# Patient Record
Sex: Female | Born: 1955
Health system: Southern US, Community
[De-identification: ages and names within clinical notes are randomized; demographics above are authoritative.]

## PROBLEM LIST (undated history)

## (undated) DIAGNOSIS — D72819 Decreased white blood cell count, unspecified: Secondary | ICD-10-CM

## (undated) DIAGNOSIS — K5900 Constipation, unspecified: Secondary | ICD-10-CM

## (undated) DIAGNOSIS — I10 Essential (primary) hypertension: Secondary | ICD-10-CM

## (undated) DIAGNOSIS — I471 Supraventricular tachycardia: Secondary | ICD-10-CM

## (undated) DIAGNOSIS — M79606 Pain in leg, unspecified: Secondary | ICD-10-CM

## (undated) DIAGNOSIS — R945 Abnormal results of liver function studies: Secondary | ICD-10-CM

## (undated) DIAGNOSIS — R7989 Other specified abnormal findings of blood chemistry: Secondary | ICD-10-CM

## (undated) DIAGNOSIS — M6282 Rhabdomyolysis: Secondary | ICD-10-CM

## (undated) DIAGNOSIS — R1013 Epigastric pain: Secondary | ICD-10-CM

## (undated) DIAGNOSIS — M546 Pain in thoracic spine: Secondary | ICD-10-CM

## (undated) DIAGNOSIS — N952 Postmenopausal atrophic vaginitis: Secondary | ICD-10-CM

## (undated) DIAGNOSIS — N183 Chronic kidney disease, stage 3 (moderate): Secondary | ICD-10-CM

## (undated) DIAGNOSIS — D649 Anemia, unspecified: Secondary | ICD-10-CM

## (undated) DIAGNOSIS — E785 Hyperlipidemia, unspecified: Secondary | ICD-10-CM

## (undated) DIAGNOSIS — E870 Hyperosmolality and hypernatremia: Secondary | ICD-10-CM

## (undated) HISTORY — DX: Hyperosmolality and hypernatremia: E87.0

## (undated) HISTORY — DX: Abnormal results of liver function studies: R94.5

## (undated) HISTORY — DX: Pain in leg, unspecified: M79.606

## (undated) HISTORY — DX: Constipation, unspecified: K59.00

## (undated) HISTORY — DX: Hyperlipidemia, unspecified: E78.5

## (undated) HISTORY — PX: BACK SURGERY: SHX140

## (undated) HISTORY — DX: Supraventricular tachycardia: I47.1

## (undated) HISTORY — DX: Anemia, unspecified: D64.9

## (undated) HISTORY — DX: Chronic kidney disease, stage 3 (moderate): N18.3

## (undated) HISTORY — DX: Epigastric pain: R10.13

## (undated) HISTORY — PX: CHOLECYSTECTOMY: SHX55

## (undated) HISTORY — DX: Pain in thoracic spine: M54.6

## (undated) HISTORY — DX: Decreased white blood cell count, unspecified: D72.819

## (undated) HISTORY — DX: Postmenopausal atrophic vaginitis: N95.2

## (undated) HISTORY — PX: OTHER SURGICAL HISTORY: SHX169

## (undated) HISTORY — DX: Rhabdomyolysis: M62.82

## (undated) HISTORY — DX: Other specified abnormal findings of blood chemistry: R79.89

## (undated) HISTORY — DX: Essential (primary) hypertension: I10

---

## 2002-09-19 ENCOUNTER — Ambulatory Visit (HOSPITAL_COMMUNITY): Admission: RE | Admit: 2002-09-19 | Discharge: 2002-09-19 | Payer: Self-pay | Admitting: Internal Medicine

## 2002-09-19 ENCOUNTER — Encounter: Payer: Self-pay | Admitting: Internal Medicine

## 2002-10-17 ENCOUNTER — Emergency Department (HOSPITAL_COMMUNITY): Admission: EM | Admit: 2002-10-17 | Discharge: 2002-10-17 | Payer: Self-pay | Admitting: Emergency Medicine

## 2002-11-15 ENCOUNTER — Encounter: Payer: Self-pay | Admitting: General Surgery

## 2002-11-17 ENCOUNTER — Encounter (INDEPENDENT_AMBULATORY_CARE_PROVIDER_SITE_OTHER): Payer: Self-pay | Admitting: *Deleted

## 2002-11-17 ENCOUNTER — Ambulatory Visit (HOSPITAL_COMMUNITY): Admission: RE | Admit: 2002-11-17 | Discharge: 2002-11-18 | Payer: Self-pay | Admitting: General Surgery

## 2003-10-16 ENCOUNTER — Ambulatory Visit (HOSPITAL_COMMUNITY): Admission: RE | Admit: 2003-10-16 | Discharge: 2003-10-16 | Payer: Self-pay | Admitting: Nurse Practitioner

## 2004-03-24 ENCOUNTER — Ambulatory Visit (HOSPITAL_COMMUNITY): Admission: RE | Admit: 2004-03-24 | Discharge: 2004-03-24 | Payer: Self-pay | Admitting: Internal Medicine

## 2004-05-26 ENCOUNTER — Ambulatory Visit: Payer: Self-pay | Admitting: Nurse Practitioner

## 2004-06-03 ENCOUNTER — Ambulatory Visit: Payer: Self-pay | Admitting: Nurse Practitioner

## 2004-06-24 ENCOUNTER — Ambulatory Visit: Payer: Self-pay | Admitting: Nurse Practitioner

## 2004-06-25 ENCOUNTER — Ambulatory Visit: Payer: Self-pay | Admitting: Nurse Practitioner

## 2004-08-05 ENCOUNTER — Ambulatory Visit: Payer: Self-pay | Admitting: Nurse Practitioner

## 2004-09-16 ENCOUNTER — Ambulatory Visit: Payer: Self-pay | Admitting: Nurse Practitioner

## 2004-11-18 ENCOUNTER — Ambulatory Visit: Payer: Self-pay | Admitting: Nurse Practitioner

## 2004-11-19 ENCOUNTER — Ambulatory Visit (HOSPITAL_COMMUNITY): Admission: RE | Admit: 2004-11-19 | Discharge: 2004-11-19 | Payer: Self-pay | Admitting: Family Medicine

## 2005-02-10 ENCOUNTER — Ambulatory Visit: Payer: Self-pay | Admitting: Nurse Practitioner

## 2005-02-18 ENCOUNTER — Ambulatory Visit: Payer: Self-pay | Admitting: Nurse Practitioner

## 2005-03-27 ENCOUNTER — Ambulatory Visit: Payer: Self-pay | Admitting: Nurse Practitioner

## 2005-08-25 ENCOUNTER — Ambulatory Visit: Payer: Self-pay | Admitting: Nurse Practitioner

## 2005-09-18 ENCOUNTER — Ambulatory Visit (HOSPITAL_COMMUNITY): Admission: RE | Admit: 2005-09-18 | Discharge: 2005-09-18 | Payer: Self-pay | Admitting: Family Medicine

## 2005-09-24 ENCOUNTER — Ambulatory Visit: Payer: Self-pay | Admitting: Nurse Practitioner

## 2005-10-23 ENCOUNTER — Ambulatory Visit: Payer: Self-pay | Admitting: Nurse Practitioner

## 2006-05-23 ENCOUNTER — Emergency Department (HOSPITAL_COMMUNITY): Admission: EM | Admit: 2006-05-23 | Discharge: 2006-05-23 | Payer: Self-pay | Admitting: Emergency Medicine

## 2006-10-25 ENCOUNTER — Ambulatory Visit (HOSPITAL_COMMUNITY): Admission: RE | Admit: 2006-10-25 | Discharge: 2006-10-25 | Payer: Self-pay | Admitting: Internal Medicine

## 2007-05-06 ENCOUNTER — Ambulatory Visit: Admission: RE | Admit: 2007-05-06 | Discharge: 2007-06-02 | Payer: Self-pay | Admitting: Radiation Oncology

## 2007-05-23 ENCOUNTER — Ambulatory Visit (HOSPITAL_BASED_OUTPATIENT_CLINIC_OR_DEPARTMENT_OTHER): Admission: RE | Admit: 2007-05-23 | Discharge: 2007-05-23 | Payer: Self-pay | Admitting: Specialist

## 2007-05-23 ENCOUNTER — Encounter (INDEPENDENT_AMBULATORY_CARE_PROVIDER_SITE_OTHER): Payer: Self-pay | Admitting: Specialist

## 2007-07-15 ENCOUNTER — Emergency Department (HOSPITAL_COMMUNITY): Admission: EM | Admit: 2007-07-15 | Discharge: 2007-07-15 | Payer: Self-pay | Admitting: Emergency Medicine

## 2007-11-16 ENCOUNTER — Ambulatory Visit (HOSPITAL_COMMUNITY): Admission: RE | Admit: 2007-11-16 | Discharge: 2007-11-16 | Payer: Self-pay | Admitting: Internal Medicine

## 2008-09-07 HISTORY — PX: LACERATION REPAIR: SHX5168

## 2009-04-29 ENCOUNTER — Encounter: Admission: RE | Admit: 2009-04-29 | Discharge: 2009-04-29 | Payer: Self-pay | Admitting: Internal Medicine

## 2009-09-27 ENCOUNTER — Emergency Department (HOSPITAL_COMMUNITY): Admission: EM | Admit: 2009-09-27 | Discharge: 2009-09-27 | Payer: Self-pay | Admitting: Emergency Medicine

## 2010-01-24 ENCOUNTER — Ambulatory Visit (HOSPITAL_COMMUNITY): Admission: RE | Admit: 2010-01-24 | Discharge: 2010-01-24 | Payer: Self-pay | Admitting: Internal Medicine

## 2010-07-14 ENCOUNTER — Encounter: Admission: RE | Admit: 2010-07-14 | Discharge: 2010-07-14 | Payer: Self-pay | Admitting: Internal Medicine

## 2010-09-28 ENCOUNTER — Encounter: Payer: Self-pay | Admitting: Internal Medicine

## 2010-11-23 LAB — POCT URINALYSIS DIP (DEVICE)
Ketones, ur: NEGATIVE mg/dL
Nitrite: NEGATIVE
Specific Gravity, Urine: 1.02 (ref 1.005–1.030)
Urobilinogen, UA: 0.2 mg/dL (ref 0.0–1.0)
pH: 5.5 (ref 5.0–8.0)

## 2010-11-23 LAB — URINE CULTURE
Colony Count: NO GROWTH
Culture: NO GROWTH

## 2011-01-20 NOTE — Op Note (Signed)
NAMEDARLENY, SEM          ACCOUNT NO.:  [id number]   MEDICAL RECORD NO.:  [id number]           PATIENT TYPE:   LOCATION:                                 FACILITY:   PHYSICIAN:  Earvin Hansen L. Truesdale, M.D.DATE OF BIRTH:  04-Jan-1956   DATE OF PROCEDURE:  05/23/2007  DATE OF DISCHARGE:                               OPERATIVE REPORT   The patient had a severe keloid involving her right neck area, increased  pain and discomfort, burning, especially when she turns and neck.   PROCEDURES PLANNED:  Excision of the area which measured over 10 cm in  length up to 2-3 cm in greatest, plastic reconstruction.  The patient  also has been prepared for postoperative low-dose irradiation treatments  to hopefully prevent the keloid from coming back.   PROCEDURE:  The patient was taken to the operating room, placed on the  operating room table in the supine position.  Was given adequate general  anesthesia and intubated orally.  Prep was done to the facial and neck  areas with Betadine soap and solution and walled off with sterile towels  and drapes so as to make a sterile field.  Excision was made of the  keloid with proper margins, and dissection was  carried down deep to the  superficial fascia.  After proper hemostasis, the superior and inferior  flaps were freed up significantly to allow [place] to advance the flaps  together without tension.  Using 2-0 Monocryl x2 layers in the deep  plane, a subdermal suture of 5-0 Monocryl and then a running  subcuticular stitch of 3-0 Monocryl.  Half-inch Steri-Strips and soft  dressings were applied including Xeroform, 4x4s, ABDs, and Hypafix tape.  She withstood the procedures very well and was taken to recovery in  excellent condition.  Estimated blood loss was less than 50 mL.  The  patient will be outpatient today and then will go to [facility] for postoperative low-dose radiation treatment starting  tomorrow.      Yaakov Guthrie. Shon Hough,  M.D.  Electronically Signed     GLT/MEDQ  D:  05/23/2007  T:  05/23/2007  Job:  [id number]

## 2011-01-23 NOTE — Op Note (Signed)
Traci Brown, Traci Brown                      ACCOUNT NO.:  [id number]   MEDICAL RECORD NO.:  [id number]                   PATIENT TYPE:  OIB   LOCATION:  5707                                 FACILITY:  [facility]   PHYSICIAN:  Gabrielle Dare. Janee Morn, M.D.             DATE OF BIRTH:  May 18, 1956   DATE OF PROCEDURE:  11/17/2002  DATE OF DISCHARGE:  11/18/2002                                 OPERATIVE REPORT   PREOPERATIVE DIAGNOSIS:  Symptomatic cholelithiasis.   POSTOPERATIVE DIAGNOSES:  1. Symptomatic cholelithiasis.  2. Fitz-Hugh-Curtis syndrome, severe.   PROCEDURE:  Laparoscopic cholecystectomy.   SURGEON:  Gabrielle Dare. Janee Morn, M.D.   ASSISTANT:  Donnie Coffin. Samuella Cota, M.D.   HISTORY OF PRESENT ILLNESS:  This patient is a 55 year old female who  presented to the office with an advanced degree of symptomatic  cholelithiasis.  Gallbladder ultrasound demonstrated one 8 mm gallstone, and  she presents for laparoscopic cholecystectomy.   PROCEDURE:  The patient was brought to the operating room.  She received  intravenous antibiotics.  General anesthesia was administered.  Her abdomen  was prepped and draped in a sterile fashion.  A semilunar infraumbilical  incision was made.  The subcutaneous tissues were dissected down, and the  anterior fascia was exposed.  This was incised sharply, and the peritoneum  was then entered under direct vision without difficulty.  A 0 Vicryl  pursestring suture was placed around the fascial opening, and a Hasson  trocar was inserted into the abdomen.  The abdomen was insufflated with  carbon dioxide in a standard fashion.  On exploration of the abdomen, there  were quite a few adhesions from her previous surgery, as well as severe Fitz-  Hugh-Curtis with adhesions from the liver up to the anterior abdominal wall.  We could not put the epigastric port in initially, so the two lateral ports  were placed under direct vision, and some extensive lysis of  adhesions  inside the abdomen was accomplished under direct vision.  We were able to  clear off enough area in order to put the 10 mm epigastric port. This was  placed under direct vision.  Subsequently, there was a great deal of omentum  also adhesed to the liver.  This was taken down with Bovie cautery as we  gradually worked our way to expose the gallbladder.  Once the dome of the  gallbladder was exposed, it was retracted superomedially.  There was a small  lobule of liver that was folded over the gallbladder.  Several adhesions  attaching it were taken down for better exposure.  We slipped in a 30-degree  scope to see the infundibulum and cystic duct area better.  Once this was  accomplished, the infundibulum was retracted inferolaterally, and dissection  was begun laterally and worked medially to expose the cystic duct  circumferentially.  Once this was accomplished, a large window was made  between the infundibulum of the gallbladder, the  cystic duct, and the liver.  The cystic duct was quite small.  A clip was then placed at the  infundibular/cystic duct junction.  The cystic duct was incised with the  Endoshears, and the Reddick cholangiogram catheter was attempted to be  placed.  The diameter of the cystic duct was only slightly larger than the  Reddick cholangiogram catheter.  After several attempts, we could not insert  it due to the small size of the cystic duct.  The cholangiogram was  abandoned, and the cholangiogram catheter was removed.  Subsequently, three  clips were placed proximally in the cystic duct, and it was divided.  Further dissection revealed the cystic artery which was clipped twice  proximally and once distally and divided.  The gallbladder was then taken  off the liver bed with Bovie cautery.  Several areas of liver bed where the  extensive adhesions which caused the capsule to tear slightly were  cauterized well, and the gallbladder was removed.  Several  more areas of the  liver bed were cauterized to get good hemostasis.  The gallbladder did tear  slightly during the dissection, so it was placed in an EndoCatch bag and  removed through the umbilical port site.  Subsequently, the abdomen was  copiously irrigated.  The gallbladder bed was rechecked, and a few areas  were cauterized again.  Excellent hemostasis was obtained.  The abdomen was  irrigated with 2 liters of saline, and the irrigant returned clear.  The  liver bed was rechecked for bleeding, and this was noticed to be hemostatic,  and all three ports were removed under direct vision.  The Hasson trocar was  removed, and pneumoperitoneum was released.  The fascia was closed at the  umbilical incision by tieing the pursestring 0 Vicryl suture.  Subsequently,  all four port sites were irrigated and then closed with running 4-0 Vicryl  subcuticular stitches.  Benzoin, Steri-Strips, and sterile dressings were  applied.  Sponge, needle, and instrument counts were correct.  The patient  tolerated the procedure well and was taken to the recovery room in stable  condition.                                               Gabrielle Dare Janee Morn, M.D.    BET/MEDQ  D:  11/17/2002  T:  11/18/2002  Job:  [id number]

## 2011-02-25 ENCOUNTER — Other Ambulatory Visit (HOSPITAL_COMMUNITY): Payer: Self-pay | Admitting: Internal Medicine

## 2011-02-25 DIAGNOSIS — Z1231 Encounter for screening mammogram for malignant neoplasm of breast: Secondary | ICD-10-CM

## 2011-03-05 ENCOUNTER — Ambulatory Visit (HOSPITAL_COMMUNITY): Payer: Self-pay

## 2011-03-10 ENCOUNTER — Encounter: Payer: Self-pay | Admitting: Internal Medicine

## 2011-03-18 ENCOUNTER — Ambulatory Visit (HOSPITAL_COMMUNITY)
Admission: RE | Admit: 2011-03-18 | Discharge: 2011-03-18 | Disposition: A | Discharge status: Home / Self Care | Payer: 59 | Source: Ambulatory Visit | Attending: Internal Medicine | Admitting: Internal Medicine

## 2011-03-18 DIAGNOSIS — Z1231 Encounter for screening mammogram for malignant neoplasm of breast: Secondary | ICD-10-CM | POA: Insufficient documentation

## 2011-04-15 ENCOUNTER — Ambulatory Visit (INDEPENDENT_AMBULATORY_CARE_PROVIDER_SITE_OTHER): Payer: Commercial Managed Care - PPO | Admitting: Internal Medicine

## 2011-04-15 ENCOUNTER — Encounter: Payer: Self-pay | Admitting: Internal Medicine

## 2011-04-15 DIAGNOSIS — M79606 Pain in leg, unspecified: Secondary | ICD-10-CM | POA: Insufficient documentation

## 2011-04-15 DIAGNOSIS — Z0000 Encounter for general adult medical examination without abnormal findings: Secondary | ICD-10-CM

## 2011-04-15 DIAGNOSIS — R252 Cramp and spasm: Secondary | ICD-10-CM

## 2011-04-15 MED ORDER — MELOXICAM 7.5 MG PO TABS: 7.5000 mg | ORAL_TABLET | Freq: Every day | ORAL | Status: AC

## 2011-04-15 NOTE — Assessment & Plan Note (Signed)
Patient has been using vimovo 500/20 mg for her leg cramps without full relief. We will check a basic metabolic panel, magnesium, and phosphorus level. If there are any abnormalities we will bring her back in to be seen again. We have prescribed meloxicam 7.5 mg daily for her to try as well we have given her stretching exercises. I have advised her that if the pain does worsen or doesn't get better she can please call our office for an appointment. Differential includes metabolic abnormality, primary muscle disorder, unacknowledged trauma.

## 2011-04-15 NOTE — Patient Instructions (Addendum)
You were seen today as a new patient. Your doctor is Dr. Dorise Hiss. We discussed your leg cramps. We are going to switch your medication today. Stop taking vimovo. We will start meloxicam 7.5 mg once daily. We will also give you some stretching exercises for you to do at home. Please come back for lab work, and come before you eat in the morning. We will call you with the results. We will see you back in one year, however if your cramps get worse or do not get better please feel free to call us for an appointment. Our number is [phone].     Knee Exercises   RANGE OF MOTION AND STRETCHING EXERCISES These exercises may help you when beginning to rehabilitate your injury. Your symptoms may resolve with or without further involvement from your physician, physical therapist or athletic trainer. While completing these exercises, remember:   Restoring tissue flexibility helps normal motion to return to the joints. This allows healthier, less painful movement and activity.  An effective stretch should be held for at least 30 seconds.  A stretch should never be painful. You should only feel a gentle lengthening or release in the stretched tissue.      STRETCH - Knee Extension, Prone  Lie on your stomach on a firm surface, such as a bed or countertop.  Place your __________ knee and leg just beyond the edge of the surface. You may wish to place a towel under the far end of your __________ thigh for comfort.  Relax your leg muscles and allow gravity to straighten your knee. Your clinician may advise you to add an ankle weight if more resistance is helpful for you.  You should feel a stretch in the back of your __________ knee. Hold this position for ____30______ seconds. Repeat __2________ times. Complete this stretch ____2______ times per day.   * Your physician, physical therapist or athletic trainer may ask you to add ankle weight to enhance your stretch.       RANGE OF MOTION - Knee Flexion,  Active  Lie on your back with both knees straight. (If this causes back discomfort, bend your opposite knee, placing your foot flat on the floor.)  Slowly slide your heel back toward your buttocks until you feel a gentle stretch in the front of your knee or thigh.   Hold for _____30_____ seconds. Slowly slide your heel back to the starting position. Repeat ___2_______ times. Complete this exercise ___2_______ times per day.      STRETCH - Quadriceps, Prone   Lie on your stomach on a firm surface, such as a bed or padded floor.  Bend your __________ knee and grasp your ankle. If you are unable to reach, your ankle or pant leg, use a belt around your foot to lengthen your reach.  Gently pull your heel toward your buttocks. Your knee should not slide out to the side. You should feel a stretch in the front of your thigh and/or knee.  Hold this position for __20________ seconds.   Repeat ______2____ times. Complete this stretch _____1_____ times per day.     STRETCH - Hamstrings, Supine   Lie on your back. Loop a belt or towel over the ball of your __________ foot.  Straighten your __________ knee and slowly pull on the belt to raise your leg.  Do not allow the __________ knee to bend. Keep your opposite leg flat on the floor.  Raise the leg until you feel a gentle stretch behind  your __________ knee or thigh. Hold this position for __30_______ seconds.  Repeat __2________ times. Complete this stretch ___2_______ times per day.      STRENGTHENING EXERCISES  These exercises may help you when beginning to rehabilitate your injury.  They may resolve your symptoms with or without further involvement from your physician, physical therapist or athletic trainer. While completing these exercises, remember:   Muscles can gain both the endurance and the strength needed for everyday activities through controlled exercises.  Complete these exercises as instructed by your physician, physical  therapist or athletic trainer.  Progress the resistance and repetitions only as guided.  You may experience muscle soreness or fatigue, but the pain or discomfort you are trying to eliminate should never worsen during these exercises.  If this pain does worsen, stop and make certain you are following the directions exactly.  If the pain is still present after adjustments, discontinue the exercise until you can discuss the trouble with your clinician.     STRENGTH - Quadriceps, Isometrics  Lie on your back with your __________ leg extended and your opposite knee bent.  Gradually tense the muscles in the front of your __________ thigh. You should see either your knee cap slide up toward your hip or increased dimpling just above the knee. This motion will push the back of the knee down toward the floor/mat/bed on which you are lying.    Hold the muscle as tight as you can without increasing your pain for ____30______ seconds.  Relax the muscles slowly and completely in between each repetition. Repeat __2________ times. Complete this exercise ___2_______ times per day.     STRENGTH - Quadriceps, Short Arcs   Lie on your back.  Place a __________ inch towel roll under your knee so that the knee slightly bends.  Raise only your lower leg by tightening the muscles in the front of your thigh. Do not allow your thigh to rise.  Hold this position for ___30_______ seconds. Repeat _____2_____ times.  Complete this exercise ____2______ times per day.       STRENGTH - Quadriceps, Straight Leg Raises  Quality counts!  Watch for signs that the quadriceps muscle is working to insure you are strengthening the correct muscles and not "cheating" by substituting with healthier muscles.  Lay on your back with your __________ leg extended and your opposite knee bent.    Tense the muscles in the front of your __________ thigh. You should see either your knee cap slide up or increased dimpling just above the  knee. Your thigh may even quiver.  Tighten these muscles even more and raise your leg 4 to 6 inches off the floor. Hold for ___30_______ seconds.  Keeping these muscles tense, lower your leg.  Relax the muscles slowly and completely in between each repetition. Repeat ___2_______ times.  Complete this exercise _____2_____ times per day.     STRENGTH - Hamstring, Curls   Lay on your stomach with your legs extended. (If you lay on a bed, your feet may hang over the edge.)  Tighten the muscles in the back of your thigh to bend your __________ knee up to 90 degrees. Keep your hips flat on the bed/floor.  Hold this position for _____25_____ seconds.  Slowly lower your leg back to the starting position. Repeat ___2_______ times. Complete this exercise ____2______ times per day.       STRENGTH - Quadriceps, Squats  Stand in a door frame so that your feet and knees are  in line with the frame.  Use your hands for balance, not support, on the frame.  Slowly lower your weight, bending at the hips and knees. Keep your lower legs upright so that they are parallel with the door frame. Squat only within the range that does not increase your knee pain. Never let your hips drop below your knees.    Slowly return upright, pushing with your legs, not pulling with your hands. Repeat ____2______ times. Complete this exercise ____2______ times per day.    STRENGTH - Quadriceps, Wall Slides    Follow guidelines for form closely.  Increased knee pain often results from poorly placed feet or knees.    Lean against a smooth wall or door and walk your feet out 18-24 inches. Place your feet hip-width apart.  Slowly slide down the wall or door until your knees bend __________ degrees.* Keep your knees over your heels, not your toes, and in line with your hips, not falling to either side.  Hold for ____30______ seconds. Stand up to rest for ___60_______ seconds in between each repetition.  Repeat  __2________ times. Complete this exercise ___1_______ times per day.   * Your physician, physical therapist or athletic trainer will alter this angle based on your symptoms and progress.   Document Released: 07/08/2005  Document Re-Released: 09/15/2009 [facility] Patient Information 2011 [place], [place].

## 2011-04-15 NOTE — Progress Notes (Signed)
Subjective:    Patient ID: Traci Brown, female    DOB: 02-Jul-1956, 55 y.o.   MRN: [id number]  HPI: This is a 55 year old woman who is new to our clinic today. She did emigrated to the [place] in 2000 from [place]. She moved to [place] in 2003. And she has been working at [employer] since 2004. She has been having leg cramps for the past 3-4 weeks, and they come and go without any exacerbating or alleviating factors. She has been trying vimovo 500/20 mg as needed for this pain she says she takes possibly 3 a day. She denies any trauma to the area. She has not tried stretching techniques. She did have a mammogram this year which was normal, and a Pap smear last year which was normal. She does not smoke, she does not use alcohol, she does not use drugs.  PMH: None  PSH: Womb tied (after birth of last daughter 18 years ago) Cholecystectomy (2003-2004 sometime) Back surgery (unknown exactly what, some time ago)  Family Hx: Mother (HTN)  Social Hx:  Was married, husband was killed in [place] in 1997. She is a never-smoker, non-drinker, no drug use. 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   Review of Systems  Constitutional: Negative for fever, chills, diaphoresis, activity change, appetite change, fatigue and unexpected weight change.  HENT: Negative.   Eyes: Negative.   Respiratory: Negative.  Negative for apnea, cough, choking, chest tightness, shortness of breath, wheezing and stridor.   Cardiovascular: Negative.  Negative for chest pain, palpitations and leg swelling.  Gastrointestinal: Negative.   Genitourinary: Negative for dysuria, urgency, frequency, vaginal discharge, difficulty urinating, genital sores and menstrual problem.  Musculoskeletal: Positive for myalgias. Negative for back pain, joint swelling, arthralgias and gait problem.       Pt is having muscle spasms in her legs in the thigh region. No swelling of the LE, no  tenderness to palpation in the calves, no recent long travel.  Skin: Negative.   Neurological: Positive for weakness and light-headedness. Negative for tremors and numbness.       Pt has weakness immediately following an episode of cramping. She feels light-headed but has never passed out. She stops and waits for the cramps to stop.  Hematological: Negative.   Psychiatric/Behavioral: Negative.        Objective:   Physical Exam  Constitutional: She is oriented to person, place, and time. She appears well-developed and well-nourished. No distress.  HENT:  Head: Normocephalic.       Pt has scar on her chin from trauma in [place].   Eyes: EOM are normal. Pupils are equal, round, and reactive to light.  Neck: Normal range of motion. Neck supple. No JVD present. No thyromegaly present.  Abdominal: Soft. Bowel sounds are normal. She exhibits no distension. There is no tenderness. There is no rebound and no guarding.  Musculoskeletal: Normal range of motion. She exhibits tenderness. She exhibits no edema.       Pt had some muscular knots in her thighs. She is not tender to palpation in her calves.   Lymphadenopathy:    She has no cervical adenopathy.  Neurological: She is alert and oriented to person, place, and time. No cranial nerve deficit.  Skin: Skin is warm and dry. No rash noted. She is not diaphoretic. No erythema. No pallor.  Psychiatric: She has a normal mood and affect. Her behavior is normal. Judgment and thought content normal.  Assessment & Plan:  1. Health Maintenance- we will obtain a fasting lipid panel on the patient. She is up to date on all other health maintenance.  2. Leg cramps - please see problem list charting.  3. Disposition - we will see pt back in 1 year. If there are any lab abnormalities we may need to see her back and have let her know she can always call our office to talk to a doctor or make an appointment.

## 2011-04-16 ENCOUNTER — Other Ambulatory Visit: Payer: Commercial Managed Care - PPO

## 2011-04-16 DIAGNOSIS — Z0000 Encounter for general adult medical examination without abnormal findings: Secondary | ICD-10-CM

## 2011-04-16 DIAGNOSIS — R252 Cramp and spasm: Secondary | ICD-10-CM

## 2011-04-16 LAB — LIPID PANEL
Cholesterol: 376 mg/dL — ABNORMAL HIGH (ref 0–200)
Total CHOL/HDL Ratio: 11.1 Ratio
Triglycerides: 253 mg/dL — ABNORMAL HIGH (ref <150)

## 2011-04-16 LAB — BASIC METABOLIC PANEL
CO2: 25 mEq/L (ref 19–32)
Chloride: 105 mEq/L (ref 96–112)
Creat: 1.34 mg/dL — ABNORMAL HIGH (ref 0.50–1.10)
Glucose, Bld: 90 mg/dL (ref 70–99)
Sodium: 141 mEq/L (ref 135–145)

## 2011-04-16 LAB — MAGNESIUM: Magnesium: 2 mg/dL (ref 1.5–2.5)

## 2011-04-16 NOTE — Progress Notes (Signed)
Ms. Lohmann history and physical examination were reviewed with Dr. Dorise Hiss and the assessment and plan were formulated together.  I agree with the documentation above.

## 2011-04-17 ENCOUNTER — Telehealth: Payer: Self-pay | Admitting: Internal Medicine

## 2011-04-17 NOTE — Telephone Encounter (Signed)
I tried 2 times to call pt today, once at home and once at work to discuss lab results. I will try to call again on Monday.

## 2011-04-21 ENCOUNTER — Telehealth: Payer: Self-pay | Admitting: Internal Medicine

## 2011-04-21 MED ORDER — SIMVASTATIN 20 MG PO TABS: 20.0000 mg | ORAL_TABLET | Freq: Every evening | ORAL | Status: DC

## 2011-04-21 NOTE — Telephone Encounter (Signed)
The pt did call me back after I left her a message to call. We did discuss the results of her blood work and I informed her that her cholesterol was high and that I did want to start her on simvastatin 20 mg QHS at this time. She was agreeable and did want to start at this time. I will send that to her pharmacy today and told her that if she has any problems or side effects from the medication to call our clinic.

## 2011-06-16 LAB — DIFFERENTIAL
Basophils Absolute: 0
Basophils Relative: 1
Eosinophils Relative: 3
Lymphocytes Relative: 49 — ABNORMAL HIGH
Lymphs Abs: 0.9
Monocytes Relative: 12 — ABNORMAL HIGH
Neutro Abs: 0.6 — ABNORMAL LOW

## 2011-06-16 LAB — I-STAT 8, (EC8 V) (CONVERTED LAB)
BUN: 13
Bicarbonate: 27.3 — ABNORMAL HIGH
HCT: 32 — ABNORMAL LOW
Operator id: 270111
TCO2: 29
pCO2, Ven: 48.8

## 2011-06-16 LAB — POCT CARDIAC MARKERS
CKMB, poc: 3
Myoglobin, poc: 123
Operator id: 270111
Troponin i, poc: <0.05

## 2011-06-16 LAB — CBC
HCT: 28 — ABNORMAL LOW
Hemoglobin: 9.4 — ABNORMAL LOW
MCHC: 33.8
RDW: 14.3 — ABNORMAL HIGH

## 2011-06-16 LAB — POCT I-STAT CREATININE: Creatinine, Ser: 1.3 — ABNORMAL HIGH

## 2011-08-06 ENCOUNTER — Emergency Department (HOSPITAL_COMMUNITY)
Admission: EM | Admit: 2011-08-06 | Discharge: 2011-08-06 | Disposition: A | Discharge status: Home / Self Care | Payer: 59 | Source: Home / Self Care | Attending: Family Medicine | Admitting: Family Medicine

## 2011-08-06 ENCOUNTER — Encounter (HOSPITAL_COMMUNITY): Payer: Self-pay

## 2011-08-06 DIAGNOSIS — J029 Acute pharyngitis, unspecified: Secondary | ICD-10-CM

## 2011-08-06 MED ORDER — AMOXICILLIN 500 MG PO CAPS: 500.0000 mg | ORAL_CAPSULE | Freq: Three times a day (TID) | ORAL | Status: AC

## 2011-08-06 NOTE — ED Notes (Signed)
C/o sore throat, headache and chills that started last pm.

## 2011-08-06 NOTE — ED Provider Notes (Signed)
History     CSN: [id number] Arrival date & time: 08/06/2011 10:12 AM   First MD Initiated Contact with Patient 08/06/11 [phone]      Chief Complaint  Patient presents with  . Sore Throat    (Consider location/radiation/quality/duration/timing/severity/associated sxs/prior treatment) Patient is a 55 y.o. female presenting with pharyngitis. The history is provided by the patient.  Sore Throat This is a new problem. The current episode started 12 to 24 hours ago. The problem occurs constantly. The problem has been gradually worsening. Associated symptoms include headaches. The symptoms are aggravated by swallowing. She has tried nothing for the symptoms.  Thinks she may have had fever. No cough. No runny nose.   Past Medical History  Diagnosis Date  . High cholesterol     Past Surgical History  Procedure Date  . Cholecystectomy   . Back surgery   . Womb tied (in [place] exact procedure unknown)     Family History  Problem Relation Age of Onset  . Hypertension Mother     History  Substance Use Topics  . Smoking status: Never Smoker   . Smokeless tobacco: Not on file  . Alcohol Use: No    OB History    Grav Para Term Preterm Abortions TAB SAB Ect Mult Living                  Review of Systems  Constitutional: Negative.   Respiratory: Negative.   Cardiovascular: Negative.   Gastrointestinal: Negative.   Genitourinary: Negative.   Skin: Negative.   Neurological: Positive for headaches.    Allergies  Review of patient's allergies indicates no known allergies.  Home Medications   Current Outpatient Rx  Name Route Sig Dispense Refill  . SIMVASTATIN 20 MG PO TABS Oral Take 1 tablet (20 mg total) by mouth every evening. 30 tablet 11  . AMOXICILLIN 500 MG PO CAPS Oral Take 1 capsule (500 mg total) by mouth 3 (three) times daily. 30 capsule 0  . MELOXICAM 7.5 MG PO TABS Oral Take 1 tablet (7.5 mg total) by mouth daily. 30 tablet 2  . VIMOVO PO Oral Take 500 mg by  mouth.        BP 136/79  Pulse 97  Temp(Src) 99.7 F (37.6 C) (Oral)  Resp 18  SpO2 99%  Physical Exam  Nursing note and vitals reviewed. Constitutional: She appears well-developed and well-nourished. No distress.  HENT:  Head: Normocephalic and atraumatic.  Right Ear: External ear normal.  Left Ear: External ear normal.  Nose: Nose normal.       Throat red and swollen  Neck: Normal range of motion. Neck supple.  Cardiovascular: Normal rate and regular rhythm.   Pulmonary/Chest: Effort normal and breath sounds normal. She has no wheezes.  Lymphadenopathy:    She has no cervical adenopathy.  Skin: Skin is warm and dry.    ED Course  Procedures (including critical care time)  Labs Reviewed - No data to display No results found.   1. Pharyngitis       MDM          Randa Spike, MD 08/06/11 1118

## 2011-11-19 ENCOUNTER — Encounter: Payer: 59 | Admitting: Internal Medicine

## 2011-12-15 ENCOUNTER — Encounter: Payer: Self-pay | Admitting: Internal Medicine

## 2011-12-15 ENCOUNTER — Ambulatory Visit (INDEPENDENT_AMBULATORY_CARE_PROVIDER_SITE_OTHER): Payer: 59 | Admitting: Internal Medicine

## 2011-12-15 DIAGNOSIS — E785 Hyperlipidemia, unspecified: Secondary | ICD-10-CM

## 2011-12-15 DIAGNOSIS — Z0000 Encounter for general adult medical examination without abnormal findings: Secondary | ICD-10-CM

## 2011-12-15 DIAGNOSIS — IMO0002 Reserved for concepts with insufficient information to code with codable children: Secondary | ICD-10-CM

## 2011-12-15 DIAGNOSIS — Z23 Encounter for immunization: Secondary | ICD-10-CM

## 2011-12-15 HISTORY — DX: Hyperlipidemia, unspecified: E78.5

## 2011-12-15 NOTE — Patient Instructions (Signed)
Please, follow up with a mammogram and a colonoscopy. You received a Tdap vaccination today. Please, call with any questions.

## 2011-12-15 NOTE — Progress Notes (Signed)
Patient ID: Traci Brown, female   DOB: May 11, 1956, 56 y.o.   MRN: [id number] HPI:    1. CP, right sided for 2 months, denies any pain with respirations, palpitations, fever, chills, sweats, SOB, orthopnea, PND, swelling. 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 She reports that she does lots of pushing and pulling and some times lifting. She denies any left sided CP or similar sx in the past. PMHx and FMHx is neg for CAD. Review of Systems: Negative except per history of present illness  Physical Exam:  Nursing notes and vitals reviewed General:  alert, well-developed, and cooperative to examination.   Lungs:  normal respiratory effort, no accessory muscle use, normal breath sounds, no crackles, and no wheezes. MSK: reproducible right-sided chest wall TTP at 3-4-5 intercostal spaces. Heart:  normal rate, regular rhythm, no murmurs, no gallop, and no rub.   Abdomen:  soft, non-tender, normal bowel sounds, no distention, no guarding, no rebound tenderness, no hepatomegaly, and no splenomegaly.   Extremities:  No cyanosis, clubbing, edema Neurologic:  alert & oriented X3, nonfocal exam  Meds: Medications Prior to Admission  Medication Sig Dispense Refill  . meloxicam (MOBIC) 7.5 MG tablet Take 1 tablet (7.5 mg total) by mouth daily.  30 tablet  2  . simvastatin (ZOCOR) 20 MG tablet Take 1 tablet (20 mg total) by mouth every evening.  30 tablet  11   No current facility-administered medications on file as of 12/15/2011.    Allergies: Review of patient's allergies indicates no known allergies. Past Medical History  Diagnosis Date  . High cholesterol    Past Surgical History  Procedure Date  . Cholecystectomy   . Back surgery   . Womb tied (in [place] exact procedure unknown)    Family History  Problem Relation Age of Onset  . Hypertension Mother    History   Social  History  . Marital Status: Single    Spouse Name: N/A    Number of Children: N/A  . Years of Education: N/A   Occupational History  . Not on file.   Social History Main Topics  . Smoking status: Never Smoker   . Smokeless tobacco: Not on file  . Alcohol Use: No  . Drug Use: No  . Sexually Active: No   Other Topics Concern  . Not on file   Social History Narrative  . No narrative on file    A/P: 1. Chest wall MSK strain due to repetitive pushing/pulling at work -continue with Mobic -D/C Naproxen -explained risk of GI bleed if used with Cox2 -handout on MSK strain -Avoid strenous phsycial activity for 1-2 weeks -apply low heat to chest wall -call if no improvement and/or go to ED  2. Health Maintenance -Tdap -Mammogram -colonsocopy -Lipids -LFT's  F/U in 1-2 weeks.

## 2011-12-16 LAB — LIPID PANEL
Cholesterol: 298 mg/dL — ABNORMAL HIGH (ref 0–200)
HDL: 34 mg/dL — ABNORMAL LOW (ref >39)
Triglycerides: 299 mg/dL — ABNORMAL HIGH (ref <150)
VLDL: 60 mg/dL — ABNORMAL HIGH (ref 0–40)

## 2011-12-16 LAB — COMPLETE METABOLIC PANEL WITH GFR
AST: 38 U/L — ABNORMAL HIGH (ref 0–37)
BUN: 19 mg/dL (ref 6–23)
CO2: 25 mEq/L (ref 19–32)
Calcium: 9.3 mg/dL (ref 8.4–10.5)
Chloride: 102 mEq/L (ref 96–112)
Creat: 1.26 mg/dL — ABNORMAL HIGH (ref 0.50–1.10)
GFR, Est African American: 56 mL/min — ABNORMAL LOW
Total Bilirubin: 0.4 mg/dL (ref 0.3–1.2)

## 2012-01-14 NOTE — Progress Notes (Signed)
Addended by: Neomia Dear on: 01/14/2012 05:59 PM   Modules accepted: Orders

## 2012-01-27 ENCOUNTER — Encounter: Payer: Self-pay | Admitting: Obstetrics and Gynecology

## 2012-04-13 ENCOUNTER — Other Ambulatory Visit: Payer: Self-pay | Admitting: Internal Medicine

## 2012-04-13 DIAGNOSIS — Z1231 Encounter for screening mammogram for malignant neoplasm of breast: Secondary | ICD-10-CM

## 2012-04-27 ENCOUNTER — Ambulatory Visit (HOSPITAL_COMMUNITY)
Admission: RE | Admit: 2012-04-27 | Discharge: 2012-04-27 | Disposition: A | Discharge status: Home / Self Care | Payer: 59 | Source: Ambulatory Visit | Attending: Internal Medicine | Admitting: Internal Medicine

## 2012-04-27 DIAGNOSIS — Z1231 Encounter for screening mammogram for malignant neoplasm of breast: Secondary | ICD-10-CM | POA: Insufficient documentation

## 2012-06-03 ENCOUNTER — Encounter: Payer: 59 | Admitting: Internal Medicine

## 2012-07-08 ENCOUNTER — Encounter: Payer: Self-pay | Admitting: Internal Medicine

## 2012-07-08 ENCOUNTER — Ambulatory Visit (HOSPITAL_COMMUNITY)
Admission: RE | Admit: 2012-07-08 | Discharge: 2012-07-08 | Disposition: A | Discharge status: Home / Self Care | Payer: 59 | Source: Ambulatory Visit | Attending: Internal Medicine | Admitting: Internal Medicine

## 2012-07-08 ENCOUNTER — Ambulatory Visit (INDEPENDENT_AMBULATORY_CARE_PROVIDER_SITE_OTHER): Payer: 59 | Admitting: Internal Medicine

## 2012-07-08 ENCOUNTER — Encounter: Payer: 59 | Admitting: Internal Medicine

## 2012-07-08 VITALS — BP 145/86 | HR 71 | Temp 97.9°F | Ht 66.0 in | Wt 205.1 lb

## 2012-07-08 DIAGNOSIS — R0789 Other chest pain: Secondary | ICD-10-CM | POA: Insufficient documentation

## 2012-07-08 DIAGNOSIS — N183 Chronic kidney disease, stage 3 unspecified: Secondary | ICD-10-CM

## 2012-07-08 DIAGNOSIS — I10 Essential (primary) hypertension: Secondary | ICD-10-CM

## 2012-07-08 DIAGNOSIS — R079 Chest pain, unspecified: Secondary | ICD-10-CM | POA: Insufficient documentation

## 2012-07-08 DIAGNOSIS — R071 Chest pain on breathing: Secondary | ICD-10-CM

## 2012-07-08 DIAGNOSIS — I129 Hypertensive chronic kidney disease with stage 1 through stage 4 chronic kidney disease, or unspecified chronic kidney disease: Secondary | ICD-10-CM

## 2012-07-08 DIAGNOSIS — E785 Hyperlipidemia, unspecified: Secondary | ICD-10-CM

## 2012-07-08 HISTORY — DX: Chronic kidney disease, stage 3 unspecified: N18.30

## 2012-07-08 HISTORY — DX: Essential (primary) hypertension: I10

## 2012-07-08 MED ORDER — ACETAMINOPHEN 500 MG PO TABS: 1000.0000 mg | ORAL_TABLET | Freq: Three times a day (TID) | ORAL | Status: AC | PRN

## 2012-07-08 MED ORDER — CYCLOBENZAPRINE HCL 5 MG PO TABS: 5.0000 mg | ORAL_TABLET | Freq: Three times a day (TID) | ORAL | Status: DC | PRN

## 2012-07-08 MED ORDER — ROSUVASTATIN CALCIUM 20 MG PO TABS: 20.0000 mg | ORAL_TABLET | Freq: Every day | ORAL | Status: DC

## 2012-07-08 MED ORDER — HYDROCHLOROTHIAZIDE 25 MG PO TABS: 25.0000 mg | ORAL_TABLET | Freq: Every day | ORAL | Status: DC

## 2012-07-08 MED ORDER — LIDOCAINE 5 % EX PTCH: 1.0000 | MEDICATED_PATCH | CUTANEOUS | Status: AC

## 2012-07-08 NOTE — Assessment & Plan Note (Signed)
Creatinine was 1.34 (GFR 52) last year and 1.26 (GFR 56) earlier this year.  She has a history of frequent NSAID use, but long-standing stage 1 hypertension is likely the primary cause.  She does not have diabetes.  We will check a urine microalbumin:creatinine ratio today and consider an ACE-inhibitor if proteinuria is demonstrated.

## 2012-07-08 NOTE — Assessment & Plan Note (Addendum)
Lipid Panel     Component Value Date/Time   CHOL 298* 12/15/2011 1542   TRIG 299* 12/15/2011 1542   HDL 34* 12/15/2011 1542   CHOLHDL 8.8 12/15/2011 1542   VLDL 60* 12/15/2011 1542   LDLCALC 204* 12/15/2011 1542     According to Framingham risk calculator, her goal LDL is < 130.  We will start rosuvastatin 20mg  daily.

## 2012-07-08 NOTE — Assessment & Plan Note (Addendum)
The most likely etiology by far is a musculoskeletal injury sustained at work, lifting and pushing patients.  The history and the reproducibility with examination today supports this.  The association with a hot feeling and dizziness along with an occasional relationship with exertion raises concern over ACS, especially with her calculated CVD risk.  This is, however, much less likely.  An EKG today is reassuring:  there was no evidence of acute ischemia and no evidence of prior infarction.  Pulmonary, pleural, and gastrointestinal etiologies are unlikely given the history and exam findings.  I have instructed the patient to avoid NSAIDs because of her renal insufficiency.  Instead, we will treat with acetaminophen and cyclobenzaprine as needed and a lidocaine patch applied to the painful area once daily. - acetaminophen 1000mg  TID as needed - cyclobenzaprine 5mg  TID as needed - lidocaine topical patch daily

## 2012-07-08 NOTE — Assessment & Plan Note (Addendum)
Blood pressures of 140/78, 136/79, and 145/86 meets criteria for stage 1 hypertension.  Her Framingham 10-year risk of CVD is 15.9%.  We will initiate therapy with hydrochlorothiazide 25mg  daily.  We will also check a microalbumin:creatinine ratio as her kidney function is impaired in the setting of hypertension. - hydrochlorothiazide 25mg  daily - microalbumin:creatinine ratio ->  21.2 (normal)

## 2012-07-08 NOTE — Progress Notes (Signed)
Subjective:    Patient ID: Traci Brown, female    DOB: Oct 04, 1955, 56 y.o.   MRN: [id number]  HPI  This is a 56 year old woman with documented histories of hyperlipidemia and musculoskeletal pain, presenting for follow up and continued pain.  Chest wall pain ONSET: pain has been off and on for months to years, but recently, pain has been worse over the past 2 days.  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  She cannot identify a particular injury or action leading to the pain but does thing her work exacerbates the pain.  QUALITY:  Sharp.  RADIATION:  Pain is located left, anterolateral, lower thoracic wall with occasional radiation to the xyphoid process.  SEVERITY:  6/10.  TIMING:  Pain occurs ~3 days per week.  Pain lasts minutes to hours.  AGGREVATING:  Lifting and pushing hospital beds.  Sitting in certain positions for too long.  Showering.  ALLEVIATING: Aleve.  ASSOCIATED SYMPTOMS: dizziness, hot feeling; she denies nausea, vomiting, diaphoresis, dyspnea, back pain, and other arthralgias and myalgias.  Hypertension Asymptomatic.  Previous blood pressures:  140/78 August 2012, 136/79 November 2012, and 145/86 today.  Hyperlipidemia Elevated total cholesterol, triglycerides, LDL and lower HDL recorded twice before.  LDL April 2013 was 204.  She was previously on simvastatin but stopped because it made her feel nauseated.   Review of Systems  Constitutional: Negative.  Negative for fever, chills and fatigue.  HENT: Negative.  Negative for hearing loss and congestion.   Eyes: Negative.  Negative for visual disturbance.  Respiratory: Negative.  Negative for cough and shortness of breath.   Cardiovascular: Negative.   Gastrointestinal: Negative.  Negative for nausea, vomiting, diarrhea, constipation and blood in stool.  Genitourinary: Negative.  Negative for dysuria and hematuria.  Musculoskeletal: Negative.  Negative for myalgias  and back pain.  Skin: Negative.  Negative for rash.  Neurological: Positive for dizziness. Negative for weakness, numbness and headaches.   History   Social History  . Marital Status: Single    Spouse Name: N/A    Number of Children: N/A  . Years of Education: N/A   Occupational History  . 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 Smoking status: Never Smoker   . Smokeless tobacco: Not on file  . Alcohol Use: No  . Drug Use: No  . Sexually Active: No   Other Topics Concern  . Not on file   Social History Narrative  . No narrative on file   Family History  Problem Relation Age of Onset  . Hypertension Mother   . Heart disease Neg Hx    Family Status  Relation Status Death Age  . Mother Deceased     Accidental death  . Father Deceased     Accidental death        Objective:   Physical Exam Constitutional: She appears well-developed and well-nourished.  Non-toxic appearance. She does not appear ill. No distress.  Ear:  Canals and TMs are normal bilaterally. Eyes: Conjunctivae normal are normal. Pupils are equal, round, and reactive to light. No scleral icterus.  Neck: Normal range of motion. Neck supple. No thyroid mass or thyromegaly present.  Cardiovascular: Normal rate, regular rhythm, normal heart sounds and normal pulses.  Exam reveals no gallop. No murmur heard. Pulmonary/Chest: Effort normal and breath sounds normal. She exhibits tenderness over the anterolateral aspect of the left chest wall.  There is no  bony tenderness per say.  She exhibits no crepitus, no deformity, no swelling and no retraction.  Abdominal: Soft. Bowel sounds are normal. She exhibits no mass. There is no tenderness.  Musculoskeletal: Back is without tenderness to palpation.  Flexion and extension of back is normal.  Lateral flexion and twisting causes her chest wall pain to worsen, but range of motion is normal. Cranial nerves:  Pupils reactive to light bilaterally; smile  is symmetric; uvula is midline and palate elevates symmetrically; tongue protrudes midline. Motor:  Grip strength and arm flexion and extension are 5/5 and equal bilaterally. Skin: Skin is warm, dry and intact. No erythema.   Filed Vitals:   07/08/12 1056  BP: 145/86  Pulse: 71  Temp: 97.9 F (36.6 C)   EKG Results:  07/08/2012 Rate:  60 PR:  172 QRS:  86 QTc:  454 EKG: normal EKG, normal sinus rhythm.     Assessment & Plan:

## 2012-07-08 NOTE — Patient Instructions (Addendum)
Chest Wall Pain  Chest wall pain is pain felt in or around the chest bones and muscles. It may take up to 6 weeks to get better. It may take longer if you are active. Chest wall pain can happen on its own. Other times, things like germs, injury, coughing, or exercise can cause the pain.  HOME CARE  Avoid activities that make you tired or cause pain. Try not to use your chest, belly (abdominal), or side muscles. Do not use heavy weights.  Put ice on the sore area.  Put ice in a plastic bag.  Place a towel between your skin and the bag.  Leave the ice on for 15 to 20 minutes for the first 2 days.  Only take medicine as told by your doctor. Avoid taking Aleve, naproxen, ibuprofen, Advil, Motrin, or related "NSAIDs". GET HELP RIGHT AWAY IF:  You have more pain or are very uncomfortable.  You have a fever.  Your chest pain gets worse.  You have new problems.  You feel sick to your stomach (nauseous) or throw up (vomit).  You start to sweat or feel lightheaded.  You have a cough with mucus (phlegm).  You cough up blood.  MAKE SURE YOU:  Understand these instructions.  Will watch your condition.  Will get help right away if you are not doing well or get worse.

## 2012-07-09 LAB — MICROALBUMIN / CREATININE URINE RATIO
Creatinine, Urine: 227.5 mg/dL
Microalb, Ur: 4.82 mg/dL — ABNORMAL HIGH (ref 0.00–1.89)

## 2012-07-11 NOTE — Progress Notes (Signed)
I saw, examined, and discussed the patient with Dr Wallace and agree with the note contained here.  

## 2012-07-21 ENCOUNTER — Encounter: Payer: Self-pay | Admitting: Internal Medicine

## 2012-07-21 ENCOUNTER — Ambulatory Visit (INDEPENDENT_AMBULATORY_CARE_PROVIDER_SITE_OTHER): Payer: 59 | Admitting: Internal Medicine

## 2012-07-21 ENCOUNTER — Telehealth: Payer: Self-pay | Admitting: Internal Medicine

## 2012-07-21 VITALS — BP 131/79 | HR 90 | Temp 98.9°F | Ht 66.0 in | Wt 208.5 lb

## 2012-07-21 DIAGNOSIS — Z0000 Encounter for general adult medical examination without abnormal findings: Secondary | ICD-10-CM

## 2012-07-21 DIAGNOSIS — E785 Hyperlipidemia, unspecified: Secondary | ICD-10-CM

## 2012-07-21 DIAGNOSIS — R0789 Other chest pain: Secondary | ICD-10-CM

## 2012-07-21 DIAGNOSIS — R071 Chest pain on breathing: Secondary | ICD-10-CM

## 2012-07-21 DIAGNOSIS — I10 Essential (primary) hypertension: Secondary | ICD-10-CM

## 2012-07-21 LAB — BASIC METABOLIC PANEL
BUN: 14 mg/dL (ref 6–23)
Calcium: 10 mg/dL (ref 8.4–10.5)
Glucose, Bld: 105 mg/dL — ABNORMAL HIGH (ref 70–99)

## 2012-07-21 NOTE — Assessment & Plan Note (Signed)
Received flu shot at work.  [facility] requires TB test.  We will draw this today.  All other items up to date.

## 2012-07-21 NOTE — Assessment & Plan Note (Signed)
Much improved with topical lidocaine patch.  We will continue having her use this and she has stopped taking cyclobenzaprine since it was ineffective.

## 2012-07-21 NOTE — Progress Notes (Signed)
  Subjective:    Patient ID: Traci Brown, female    DOB: 18-Dec-1955, 56 y.o.   MRN: [id number]  CC: headache and follow up  HPI:  This is a 56 year old woman with newly diagnosed hyperlipidemia and hypertension who presents for follow up and with a complaint of headache.  At her last visit with me, she was having left sided chest wall pain that was clearly musculoskeletal and quite localized.  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  I prescribed cyclobenzaprine and a topical lidocaine patch.  The cyclobenzaprine had a paradoxical reaction, causing back spasms, so she stopped taking this after 2 doses.  The lidocaine patch, however, has resulted in marked improvement.  Her pain is almost completely resolved.  With the start of hydrochlorothiazide for hypertension and atorvastatin for hyperlipidemia, she reports no adverse reaction to these medicines.  HEADACHE ONSET was 3 days ago.  PROVOKED by lack of sleep associated with switching from days to nights and then back to days over the past few days.  QUALITY is sharp.  LOCATION is frontal and bilateral.  No RADIATION reported.  SEVERITY is rated 9/10.  TIMING was constant over the past several days but now resolved.  ASSOCIATED SYMPTOM is sinonasal congestion that has now resolved as well.  Tylenol was used today with complete resolution of symptoms.  She denies numbness, weakness, dizziness, tinnitus, visual disturbances, chest pain, dyspnea, rhinorrhea, and sore throat.    Review of Systems  Constitutional: Negative.  Negative for fever and chills.  HENT: Negative.  Negative for sore throat, rhinorrhea and tinnitus.   Eyes: Negative.  Negative for visual disturbance.  Respiratory: Negative.  Negative for cough and shortness of breath.   Cardiovascular: Negative.  Negative for chest pain.  Gastrointestinal: Negative.  Negative for nausea, vomiting, abdominal pain, diarrhea and constipation.    Neurological: Negative.  Negative for dizziness, weakness, light-headedness and numbness.       Objective:   Physical Exam GENERAL: overweight; no acute distress HEAD: atraumatic, normocephalic EYES: pupils equal, round and reactive; sclera anicteric; normal conjunctiva EARS: canals patent and TMs normal bilaterally NOSE/THROAT: oropharynx clear, moist mucous membranes, non-inflamed nasal mucosa, pink gums, normal dentition NECK: supple, thyroid normal in size and without palpable nodules LYMPH: no cervical or supraclavicular lymphadenopathy LUNGS: clear to auscultation bilaterally, normal work of breathing HEART: tachycardic rate and regular rhythm; normal S1 and S2 without S3 or S4; no murmurs, rubs, or clicks PULSES: radial 2+ and symmetric CRANIAL NERVES: pupils reactive to light bilaterally; extra occular muscles are intact; palate elevates symmetrically SKIN: warm, dry, intact   Filed Vitals:   07/21/12 1522  BP: 131/79  Pulse: 90  Temp: 98.9 F (37.2 C)              Assessment & Plan:

## 2012-07-21 NOTE — Telephone Encounter (Signed)
Called patient regarding sodium of 152.  She says she is still feeling fine.  She is working here in the hospital tomorrow morning and will be able to come by.  I have sent a message to the office staff to contact her tomorrow morning and arrange this.

## 2012-07-21 NOTE — Assessment & Plan Note (Addendum)
BP Readings from Last 3 Encounters:  07/21/12 131/79  07/08/12 145/86  08/06/11 136/79   Blood pressure now within normal range.  We will repeat BMET today and have her continue taking HCTZ 25mg  daily.  ADDENDUM: BMET returned with Na of 152. Message sent to front desk to call pt and have her come back in tomorrow.  BMET Component Value Date/Time   NA 152* 07/21/2012 1552   K 3.9 07/21/2012 1552   CL 92* 07/21/2012 1552   CO2 32 07/21/2012 1552   GLUCOSE 105* 07/21/2012 1552   BUN 14 07/21/2012 1552   CREATININE 1.18* 07/21/2012 1552   CALCIUM 10.0 07/21/2012 1552

## 2012-07-21 NOTE — Assessment & Plan Note (Signed)
Rosuvastatin 20mg  daily started on July 08 2012.  Recheck lipids at next visit to assess for response.

## 2012-07-21 NOTE — Patient Instructions (Signed)
Continue your current medicines.  We will have you see Dr. Dorise Hiss next year.

## 2012-07-22 ENCOUNTER — Encounter: Payer: Self-pay | Admitting: Internal Medicine

## 2012-07-22 ENCOUNTER — Ambulatory Visit (INDEPENDENT_AMBULATORY_CARE_PROVIDER_SITE_OTHER): Payer: 59 | Admitting: Internal Medicine

## 2012-07-22 ENCOUNTER — Telehealth: Payer: Self-pay | Admitting: *Deleted

## 2012-07-22 VITALS — BP 141/69 | HR 87 | Temp 97.2°F | Ht 66.0 in | Wt 208.0 lb

## 2012-07-22 DIAGNOSIS — E870 Hyperosmolality and hypernatremia: Secondary | ICD-10-CM

## 2012-07-22 HISTORY — DX: Hyperosmolality and hypernatremia: E87.0

## 2012-07-22 LAB — BASIC METABOLIC PANEL
Calcium: 9.8 mg/dL (ref 8.4–10.5)
Sodium: 139 mEq/L (ref 135–145)

## 2012-07-22 LAB — OSMOLALITY, URINE: Osmolality, Ur: 826 mOsm/kg (ref 390–1090)

## 2012-07-22 LAB — SODIUM, URINE, RANDOM: Sodium, Ur: 25 mEq/L

## 2012-07-22 LAB — CREATININE, URINE, RANDOM: Creatinine, Urine: 380.8 mg/dL

## 2012-07-22 NOTE — Telephone Encounter (Signed)
Called pt. Pt informed Dr Earlene Plater wants her to come in to see him today. Appt schedule for 2:15PM.

## 2012-07-22 NOTE — Progress Notes (Signed)
Subjective:    Patient ID: Traci Brown, female    DOB: [birth year], 56 y.o.   MRN: [id number]  CC: hypernatremia  HPI:  I saw this woman yesterday in clinic for routine follow up.  On November 1, I started her on hydrochlorothiazide, atorvastatin, and topical lidocaine.  At recheck yesterday, BMET was collected with returned high at 152.  From yesterday's visit, there is no apparent cause.  Today, I re-interrogated the patient.  She denies nausea, vomiting, diarrhea, fevers, and chills.  She has not been exercising at all, much less very strenuous exercising that could induce substantial insensible losses.  Her headache that she reported yesterday, has resolved and has not returned.  She used to experience occasional muscle cramps, but she has not experienced this since before her November 1 visit.  She reports clear to light yellow urine.  She urinates about 2 to 3 times during the day and 4 to 5 times overnight.  She denies incontinence.  She takes no medicines aside from those documented here; she does not take salt tablets.  She use to take mustard for cramps but has not needed this.  She denies dizziness, numbness, and weakness.    Review of Systems  Constitutional: Negative for fever, chills and diaphoresis.  Gastrointestinal: Negative for diarrhea.  Genitourinary: Negative for dysuria and urgency.  Neurological: Negative for dizziness, weakness, light-headedness, numbness and headaches.    Current Outpatient Rx  Name  Route  Sig  Dispense  Refill  . ACETAMINOPHEN 500 MG PO TABS   Oral   Take 2 tablets (1,000 mg total) by mouth every 8 (eight) hours as needed for pain.         Marland Kitchen HYDROCHLOROTHIAZIDE 25 MG PO TABS   Oral   Take 1 tablet (25 mg total) by mouth daily.   30 tablet   3   . LIDOCAINE 5 % EX PTCH   Transdermal   Place 1 patch onto the skin daily. Place patch over area of pain. Remove & Discard patch within 12 hours.   30 patch   3   . ROSUVASTATIN CALCIUM 20  MG PO TABS   Oral   Take 1 tablet (20 mg total) by mouth daily.   30 tablet   3         Objective:   Physical Exam GENERAL: well developed, well nourished; no acute distress HEAD: atraumatic, normocephalic EYES: pupils equal, round and reactive; sclera anicteric; normal conjunctiva EARS: hearing intact, tested with tuning fork NOSE/THROAT: oropharynx clear, moist mucous membranes, pink gums, normal dentition PULSES: radial 2+ and symmetric MOTOR: 5/5 grip, arm flexion, arm extension, shoulder shrugging, neck rotation, hip flexion SENSATION: intact REFLEXES: unable to elicit patellar, brachioradialis, or biceps reflexes CRANIAL NERVES: pupils reactive to light bilaterally; extra occular muscles are intact; facial sensation is intact and equal bilaterally in V1, V2, and V3, and masseter and temporalis function is intact; forehead wrinkles symmetrically, orbicularis oculi strength is normal and equal bilaterally, smile is symmetric, cheeks puff out equally without air excursion, and depressor anguli oris function is intact bilaterally; hearing is equal bilaterally, tested with a tuning fork; uvula is midline and palate elevates symmetrically; trapezius and sternocleidomastoid strength is normal and equal bilaterally; tongue protrudes midline. SKIN: warm, dry, intact, normal skin turgor EXTREMITIES: no peripheral edema, no clubbing   Filed Vitals:   07/22/12 1341  BP: 141/69  Pulse: 87  Temp: 97.2 F (36.2 C)    BMET    Component Value Date/Time  NA 152* 07/21/2012 1552   K 3.9 07/21/2012 1552   CL 92* 07/21/2012 1552   CO2 32 07/21/2012 1552   GLUCOSE 105* 07/21/2012 1552   BUN 14 07/21/2012 1552   CREATININE 1.18* 07/21/2012 1552   CALCIUM 10.0 07/21/2012 1552          Assessment & Plan:

## 2012-07-22 NOTE — Patient Instructions (Addendum)
We will wait for the labs gathered today and let you know if further testing or treatment is needed.  HOME CARE INSTRUCTIONS  Drink enough fluids to keep your urine clear or pale yellow.  Take all medicines as directed. Review all of your medicines with your caregiver regularly.  Avoid high-salt processed foods (canned, jarred, frozen, or boxed foods). Some examples include pickles, frozen dinners, canned soups, potato and corn chips, and olives.  Always replace fluids after exercise or after vomiting or diarrhea.  Manage underlying conditions that may cause hypernatremia. Ask your caregiver for additional support.  Follow up with your caregiver for ongoing monitoring as directed.

## 2012-07-22 NOTE — Assessment & Plan Note (Addendum)
I am at a loss as to where this is coming from.  Her history is negative for extrarenal water losses, and I do not suspect sodium overload.  Her report of, perhaps, increased urinary frequency (3 to 5 times overnight) raises the concern for renal water loss.  To sort this out, we will recheck BMET today and gather urine studies, including sodium, creatinine, and osmolality.  If diabetes insipidus is suspected after these urine studies, additional work up will be necessary, including water restriction and desmopressin stimulation testing. - BMET - urine sodium - urine creatinine - urine osmolality  ADDENDUM: Cause of hypernatremia remains elusive.  Urine osmolality was > 800 (826) making diabetes insipitus unlikely and urine sodium was 25 suggesting an extrarenal loss.  Since it has normalized, sodium now 139, no further workup will be pursued.  Routine follow up. BMET Component Value Date/Time   NA 139 07/22/2012 1336   K 3.9 07/22/2012 1336   CL 101 07/22/2012 1336   CO2 30 07/22/2012 1336   GLUCOSE 97 07/22/2012 1336   BUN 15 07/22/2012 1336   CREATININE 1.22* 07/22/2012 1336   CALCIUM 9.8 07/22/2012 1336  Urine osmolality:  826 Urine sodium:  25 Urine creatinine:  380.8 FENa:  0.06%

## 2012-07-22 NOTE — Telephone Encounter (Signed)
Message copied by Hassan Buckler on Fri Jul 22, 2012  1:11 PM ------      Message from: Gwynn Burly B      Created: Thu Jul 21, 2012  5:45 PM      Regarding: Appointment needed ASAP       Patient had BMET today, sodium returned 152.  Please have her come in to see me tomorrow afternoon.  Tomorrow morning is okay too if afternoon is not convenient.  She can see one of the other docs.            Dr. Earlene Plater

## 2012-07-25 ENCOUNTER — Ambulatory Visit: Payer: 59 | Admitting: Internal Medicine

## 2012-07-27 ENCOUNTER — Encounter: Payer: Self-pay | Admitting: Internal Medicine

## 2013-06-26 ENCOUNTER — Other Ambulatory Visit: Payer: Self-pay | Admitting: Internal Medicine

## 2013-06-26 DIAGNOSIS — Z1231 Encounter for screening mammogram for malignant neoplasm of breast: Secondary | ICD-10-CM

## 2013-07-06 ENCOUNTER — Ambulatory Visit (HOSPITAL_COMMUNITY)
Admission: RE | Admit: 2013-07-06 | Discharge: 2013-07-06 | Disposition: A | Discharge status: Home / Self Care | Payer: 59 | Source: Ambulatory Visit | Attending: Internal Medicine | Admitting: Internal Medicine

## 2013-07-06 DIAGNOSIS — Z1231 Encounter for screening mammogram for malignant neoplasm of breast: Secondary | ICD-10-CM | POA: Insufficient documentation

## 2013-07-13 ENCOUNTER — Other Ambulatory Visit: Payer: Self-pay

## 2013-07-20 ENCOUNTER — Ambulatory Visit (INDEPENDENT_AMBULATORY_CARE_PROVIDER_SITE_OTHER): Payer: 59 | Admitting: Internal Medicine

## 2013-07-20 ENCOUNTER — Encounter: Payer: Self-pay | Admitting: Internal Medicine

## 2013-07-20 VITALS — BP 125/71 | HR 86 | Temp 98.2°F | Wt 206.3 lb

## 2013-07-20 DIAGNOSIS — R252 Cramp and spasm: Secondary | ICD-10-CM

## 2013-07-20 DIAGNOSIS — E785 Hyperlipidemia, unspecified: Secondary | ICD-10-CM

## 2013-07-20 DIAGNOSIS — I10 Essential (primary) hypertension: Secondary | ICD-10-CM

## 2013-07-20 LAB — BASIC METABOLIC PANEL WITH GFR
Calcium: 9.5 mg/dL (ref 8.4–10.5)
GFR, Est African American: 51 mL/min — ABNORMAL LOW
Sodium: 138 mEq/L (ref 135–145)

## 2013-07-20 LAB — LIPID PANEL
Cholesterol: 437 mg/dL — ABNORMAL HIGH (ref 0–200)
LDL Cholesterol: 337 mg/dL — ABNORMAL HIGH (ref 0–99)
Triglycerides: 329 mg/dL — ABNORMAL HIGH (ref <150)

## 2013-07-20 NOTE — Patient Instructions (Signed)
We will see you back in 3-6 months. If you need anything before then or are having problems please call us at [phone].

## 2013-07-21 NOTE — Assessment & Plan Note (Signed)
BP under good control now and likely was partial cause of decreased kidney function. Will recheck BMP at today's visit.

## 2013-07-21 NOTE — Addendum Note (Signed)
Addended by: Genella Mech A on: 07/21/2013 01:40 PM   Modules accepted: Orders

## 2013-07-21 NOTE — Assessment & Plan Note (Signed)
Patient does still have intermittent leg cramps although the frequency has diminished over time.

## 2013-07-21 NOTE — Assessment & Plan Note (Signed)
Will recheck lipid panel at today's visit and if needed a direct LDL can be added.

## 2013-07-21 NOTE — Progress Notes (Signed)
  Subjective:    Patient ID: Traci Brown, female    DOB: 1955-09-11, 57 y.o.   MRN: [id number]  HPI This is a 57 year old woman who is coming in for a follow up visit. She has PMH of CKD stage III, HTN, hyperlipidemia, some PTSD and anxiety from living in a war zone in [place]. She does not smoke, she does not use alcohol, she does not use drugs.She denies chest pains and states that the cramps still come sometimes but not as often. She recently (3-4 months ago) went back to [place] to see the graves of her parents and husband. She was able to make peace with some of her memories and ghosts during that visit and she seems to be more at ease during today's visit.   Review of Systems  Constitutional: Negative for fever, chills, diaphoresis, activity change, appetite change, fatigue and unexpected weight change.  HENT: Negative.   Eyes: Negative.   Respiratory: Negative.  Negative for apnea, cough, choking, chest tightness, shortness of breath, wheezing and stridor.   Cardiovascular: Negative.  Negative for chest pain, palpitations and leg swelling.  Gastrointestinal: Negative.   Genitourinary: Negative for dysuria, urgency, frequency, vaginal discharge, difficulty urinating, genital sores and menstrual problem.  Musculoskeletal: Positive for myalgias. Negative for arthralgias, back pain, gait problem and joint swelling.       Pt is having muscle spasms in her legs in the thigh region. No swelling of the LE, no tenderness to palpation in the calves, no recent long travel.  Skin: Negative.   Neurological: Positive for light-headedness. Negative for tremors, weakness and numbness.  Psychiatric/Behavioral: Negative.       Objective:   Physical Exam  Constitutional: She is oriented to person, place, and time. She appears well-developed and well-nourished. No distress.  HENT:  Head: Normocephalic.  Pt has scar on her chin from trauma in [place].   Eyes: EOM are normal. Pupils are  equal, round, and reactive to light.  Neck: Normal range of motion. Neck supple. No JVD present. No thyromegaly present.  Abdominal: Soft. Bowel sounds are normal. She exhibits no distension. There is no tenderness. There is no rebound and no guarding.  Musculoskeletal: Normal range of motion. She exhibits tenderness. She exhibits no edema.  Pt had some tenderness in her thighs posteriorly. She is not tender to palpation in her calves.   Lymphadenopathy:    She has no cervical adenopathy.  Neurological: She is alert and oriented to person, place, and time. No cranial nerve deficit.  Skin: Skin is warm and dry. No rash noted. She is not diaphoretic. No erythema. No pallor.  Psychiatric: She has a normal mood and affect. Her behavior is normal. Judgment and thought content normal.      Assessment & Plan:   1.Please see problem oriented charting.  3. Disposition - we will see pt back in 3-6 months. If there are any lab abnormalities we may need to see her back and have let her know she can always call our office to talk to a doctor or make an appointment. She has had flu shot already within health system and she got BMP and lipid panel today.

## 2013-07-21 NOTE — Assessment & Plan Note (Signed)
BP Readings from Last 3 Encounters:  07/20/13 125/71  07/22/12 141/69  07/21/12 131/79    Lab Results  Component Value Date   NA 138 07/20/2013   K 3.4* 07/20/2013   CREATININE 1.36* 07/20/2013    Assessment: Blood pressure control: controlled Progress toward BP goal:  at goal Comments:   Plan: Medications:  continue current medications, HCTZ 25 mg daily Educational resources provided: brochure Self management tools provided: instructions for home blood pressure monitoring Other plans:

## 2013-07-24 MED ORDER — ROSUVASTATIN CALCIUM 40 MG PO TABS: 40.0000 mg | ORAL_TABLET | Freq: Every day | ORAL | Status: DC

## 2013-07-24 NOTE — Addendum Note (Signed)
Addended by: Genella Mech A on: 07/24/2013 08:50 AM   Modules accepted: Orders

## 2013-07-24 NOTE — Progress Notes (Signed)
Case discussed with Dr. Kollar at the time of the visit.  We reviewed the resident's history and exam and pertinent patient test results.  I agree with the assessment, diagnosis, and plan of care documented in the resident's note.     

## 2013-08-02 ENCOUNTER — Telehealth: Payer: Self-pay | Admitting: *Deleted

## 2013-08-02 MED ORDER — ATORVASTATIN CALCIUM 80 MG PO TABS: 80.0000 mg | ORAL_TABLET | Freq: Every day | ORAL | Status: DC

## 2013-08-02 NOTE — Telephone Encounter (Signed)
Her cholesterol is fairly high so I would like to keep her on a high potency statin. I will send in something else.   Thanks

## 2013-08-02 NOTE — Telephone Encounter (Signed)
Pt ask if you can change her crestor to something else due to cost at [facility], copay is now $25.00 and she cannot afford that

## 2014-01-05 ENCOUNTER — Encounter: Payer: 59 | Admitting: Internal Medicine

## 2014-02-02 ENCOUNTER — Encounter: Payer: 59 | Admitting: Internal Medicine

## 2014-03-01 ENCOUNTER — Ambulatory Visit (HOSPITAL_COMMUNITY)
Admission: RE | Admit: 2014-03-01 | Discharge: 2014-03-01 | Disposition: A | Discharge status: Home / Self Care | Payer: 59 | Source: Ambulatory Visit | Attending: Oncology | Admitting: Oncology

## 2014-03-01 ENCOUNTER — Ambulatory Visit (INDEPENDENT_AMBULATORY_CARE_PROVIDER_SITE_OTHER): Payer: 59 | Admitting: Internal Medicine

## 2014-03-01 ENCOUNTER — Encounter: Payer: Self-pay | Admitting: Internal Medicine

## 2014-03-01 VITALS — BP 108/74 | HR 76 | Temp 97.9°F | Ht 66.0 in | Wt 198.9 lb

## 2014-03-01 DIAGNOSIS — M546 Pain in thoracic spine: Secondary | ICD-10-CM

## 2014-03-01 DIAGNOSIS — M549 Dorsalgia, unspecified: Secondary | ICD-10-CM | POA: Insufficient documentation

## 2014-03-01 DIAGNOSIS — I10 Essential (primary) hypertension: Secondary | ICD-10-CM

## 2014-03-01 HISTORY — DX: Pain in thoracic spine: M54.6

## 2014-03-01 MED ORDER — TRAMADOL HCL 50 MG PO TABS: 50.0000 mg | ORAL_TABLET | Freq: Three times a day (TID) | ORAL | Status: DC | PRN

## 2014-03-01 NOTE — Progress Notes (Signed)
Patient ID: Traci Brown, female   DOB: [birth year], 58 y.o.   MRN: [id number]  Subjective:   Patient ID: Traci Brown female   DOB: [birth year] 58 y.o.   MRN: [id number]  HPI: Traci Brown is a 58 y.o. CKD stage III, HTN, hyperlipidemia, some PTSD and anxiety from living in [place] presents with back pain.   She works as a patient transporter in the hospital and states that she hurt her back 1 week ago. She states that it is intermittent and is aching in nature located at the center of her back along her thoracic spine, no radiculopathy associated. She has been taking Ibuprofen with only limited relief. She has not tried warm compresses or any other medications. Massage also improves her pain. She endorses pain like this in the past that resolved spontaneously. She is requesting pain medication.    Past Medical History  Diagnosis Date  . High cholesterol   . Hypertension 07/08/2012  . Leg cramps 04/15/2011    Pt was having cramps in the thigh   . Left-sided chest wall pain 07/08/2012  . CKD (chronic kidney disease) stage 3, GFR 30-59 ml/min 07/08/2012   Current Outpatient Prescriptions  Medication Sig Dispense Refill  . atorvastatin (LIPITOR) 80 MG tablet Take 1 tablet (80 mg total) by mouth daily.  30 tablet  11  . hydrochlorothiazide (HYDRODIURIL) 25 MG tablet Take 1 tablet (25 mg total) by mouth daily.  30 tablet  3   No current facility-administered medications for this visit.   Family History  Problem Relation Age of Onset  . Hypertension Mother   . Heart disease Neg Hx    History   Social History  . Marital Status: Single    Spouse Name: N/A    Number of Children: N/A  . Years of Education: N/A   Occupational History  . 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 Smoking status: Never Smoker   . Smokeless tobacco: None  . Alcohol Use: No  . Drug Use: No  . Sexual Activity: No   Other Topics Concern  . None    Social History Narrative  . None   Review of Systems: Constitutional: Denies fever, chills, or fatigue.  HEENT: Denies neck pain or neck stiffness  Respiratory: Denies SOB or DOE Cardiovascular: Denies chest pain.  Gastrointestinal: Denies nausea, vomiting, abdominal pain Genitourinary: Denies dysuria, urgency, frequency Musculoskeletal: +back pain.  Skin: Denies rash and wound.  Neurological: Denies weakness or numbness.  Psychiatric/Behavioral: Denies suicidal ideation, mood changes  Objective:  Physical Exam: Filed Vitals:   03/01/14 1550  BP: 108/74  Pulse: 76  Temp: 97.9 F (36.6 C)  TempSrc: Oral  Height: 5\' 6"  (1.676 m)  Weight: 198 lb 14.4 oz (90.22 kg)  SpO2: 98%   Constitutional: Vital signs reviewed.  Patient is a well-developed and well-nourished female in no acute distress and cooperative with exam.   Head: Normocephalic and atraumatic Eyes: PERRL, EOMI.  Neck: Normal ROM Cardiovascular: RRR, no MRG Pulmonary/Chest: Normal respiratory effort, CTAB Abdominal: Soft. Non-tender, non-distended Musculoskeletal: No joint deformities or erythema. Full ROM in all extremities and spine, but pain present along thoracic spine with flexion, extension, and rotation of the spine. Tenderness to palpation along thoracic spine only. No paraspinal tenderness. ROM full and no nontender Neurological: A&O x3, Strength is normal and symmetric bilaterally, cranial nerve II-XII are grossly intact, no focal motor deficit.  Skin: Warm, dry and intact. No rash.  Psychiatric: Normal mood and affect.   Assessment & Plan:   Please refer to Problem List based Assessment and Plan

## 2014-03-01 NOTE — Assessment & Plan Note (Addendum)
BP Readings from Last 3 Encounters:  03/01/14 108/74  07/20/13 125/71  07/22/12 141/69    Lab Results  Component Value Date   NA 138 07/20/2013   K 3.4* 07/20/2013   CREATININE 1.36* 07/20/2013    Assessment: Blood pressure control: controlled Progress toward BP goal:  at goal   Plan: Medications:  continue current medications Educational resources provided:   Self management tools provided:   Other plans: F/u with PCP in about 1 month to establish care and check BMP to assess renal function.

## 2014-03-01 NOTE — Assessment & Plan Note (Addendum)
Pt with full ROM but pain present along thoracic spine with flexion, extension, and rotation of the spine. Tenderness to palpation along thoracic spine only. No paraspinal tenderness. Strength is intact. Given the location of her pain and that it is along her thoracic spine, which is atypical, will check thoracic X-ray to assess for possible fracture, joint space narrowing, or even malignancy. Also prescribing Tramadol PRN. If her pain persists, she will likely need further imaging. A sed rate would be nonspecific but could show if she is having some soft of inflammatory process. - Thoracic x-ray, 2 views - Tramadol 50mg  q8h PRN , disp #20, 0 refills

## 2014-03-01 NOTE — Progress Notes (Signed)
Case discussed with Dr. Glenn soon after the resident saw the patient.  We reviewed the resident's history and exam and pertinent patient test results.  I agree with the assessment, diagnosis, and plan of care documented in the resident's note. 

## 2014-03-01 NOTE — Patient Instructions (Addendum)
**  Try to rest for the next few days. You can take Tramadol 1 tablet every 8 hours as needed for pain. If your pain worsens, please call the clinic.    General Instructions:   Please bring your medicines with you each time you come to clinic.  Medicines may include prescription medications, over-the-counter medications, herbal remedies, eye drops, vitamins, or other pills.   Progress Toward Treatment Goals:  Treatment Goal 03/01/2014  Blood pressure at goal    Self Care Goals & Plans:  Self Care Goal 07/20/2013  Manage my medications take my medicines as prescribed  Monitor my health check my feet daily  Eat healthy foods eat more vegetables  Be physically active find an activity I enjoy  Meeting treatment goals maintain the current self-care plan    No flowsheet data found.   Care Management & Community Referrals:  Referral 07/20/2013  Referrals made for care management support none needed

## 2014-03-29 ENCOUNTER — Ambulatory Visit: Payer: 59 | Admitting: Internal Medicine

## 2014-05-01 ENCOUNTER — Ambulatory Visit (INDEPENDENT_AMBULATORY_CARE_PROVIDER_SITE_OTHER): Payer: 59 | Admitting: Internal Medicine

## 2014-05-01 ENCOUNTER — Encounter: Payer: Self-pay | Admitting: Internal Medicine

## 2014-05-01 VITALS — BP 120/64 | HR 76 | Temp 97.9°F | Ht 66.0 in | Wt 194.3 lb

## 2014-05-01 DIAGNOSIS — N183 Chronic kidney disease, stage 3 unspecified: Secondary | ICD-10-CM

## 2014-05-01 DIAGNOSIS — D649 Anemia, unspecified: Secondary | ICD-10-CM

## 2014-05-01 DIAGNOSIS — I10 Essential (primary) hypertension: Secondary | ICD-10-CM

## 2014-05-01 DIAGNOSIS — R7401 Elevation of levels of liver transaminase levels: Secondary | ICD-10-CM

## 2014-05-01 DIAGNOSIS — M546 Pain in thoracic spine: Secondary | ICD-10-CM

## 2014-05-01 DIAGNOSIS — E785 Hyperlipidemia, unspecified: Secondary | ICD-10-CM

## 2014-05-01 DIAGNOSIS — R7402 Elevation of levels of lactic acid dehydrogenase (LDH): Secondary | ICD-10-CM

## 2014-05-01 DIAGNOSIS — R740 Nonspecific elevation of levels of transaminase and lactic acid dehydrogenase [LDH]: Secondary | ICD-10-CM

## 2014-05-01 LAB — CBC
HCT: 28.8 % — ABNORMAL LOW (ref 36.0–46.0)
Hemoglobin: 9.2 g/dL — ABNORMAL LOW (ref 12.0–15.0)
MCH: 27.7 pg (ref 26.0–34.0)
MCHC: 31.9 g/dL (ref 30.0–36.0)
MCV: 86.7 fL (ref 78.0–100.0)
PLATELETS: 206 10*3/uL (ref 150–400)
RBC: 3.32 MIL/uL — ABNORMAL LOW (ref 3.87–5.11)
RDW: 14.7 % (ref 11.5–15.5)
WBC: 4.2 10*3/uL (ref 4.0–10.5)

## 2014-05-01 MED ORDER — TRAMADOL HCL 50 MG PO TABS: 50.0000 mg | ORAL_TABLET | Freq: Three times a day (TID) | ORAL | Status: DC | PRN

## 2014-05-01 NOTE — Progress Notes (Signed)
Patient ID: Traci Brown, female   DOB: 1955-10-21, 58 y.o.   MRN: [id number]   Subjective:   Patient ID: Traci Brown female   DOB: 1955-09-13 58 y.o.   MRN: [id number]  HPI: [name] is a 58 y.o. with PMH of Chronic back pain, Hyperlipidemia, HTN, presented today for medication refill and routine medical visit. Please see problem based charting for assessment and plan.  Past Medical History  Diagnosis Date  . High cholesterol   . Hypertension 07/08/2012  . Leg cramps 04/15/2011    Pt was having cramps in the thigh   . Left-sided chest wall pain 07/08/2012  . CKD (chronic kidney disease) stage 3, GFR 30-59 ml/min 07/08/2012   Current Outpatient Prescriptions  Medication Sig Dispense Refill  . atorvastatin (LIPITOR) 80 MG tablet Take 1 tablet (80 mg total) by mouth daily.  30 tablet  11  . hydrochlorothiazide (HYDRODIURIL) 25 MG tablet Take 1 tablet (25 mg total) by mouth daily.  30 tablet  3  . traMADol (ULTRAM) 50 MG tablet Take 1 tablet (50 mg total) by mouth every 8 (eight) hours as needed.  60 tablet  0   No current facility-administered medications for this visit.   Family History  Problem Relation Age of Onset  . Hypertension Mother   . Heart disease Neg Hx    History   Social History  . Marital Status: Single    Spouse Name: N/A    Number of Children: N/A  . Years of Education: N/A   Occupational History  . 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 Smoking status: Never Smoker   . Smokeless tobacco: None  . Alcohol Use: No  . Drug Use: No  . Sexual Activity: No   Other Topics Concern  . None   Social History Narrative  . None   Review of Systems: CONSTITUTIONAL- No Fever, weightloss, night sweat or change in appetite. SKIN- No Rash, colour changes or itching. HEAD- No Headache or dizziness. Mouth/throat- No Sorethroat, dentures, or bleeding gums. RESPIRATORY- No Cough or SOB. CARDIAC- No Palpitations, DOE, PND or  chest pain. GI- No nausea, vomiting, diarrhoea, constipation, abd pain. URINARY- No Frequency, urgency, straining or dysuria. NEUROLOGIC- No Numbness, syncope, seizures or burning. [facility]- Denies depression or anxiety.  Objective:  Physical Exam: Filed Vitals:   05/01/14 1609  BP: 120/64  Pulse: 76  Temp: 97.9 F (36.6 C)  TempSrc: Oral  Height:  (1.676 m)  Weight: 194 lb 4.8 oz (88.134 kg)  SpO2: 100%   GENERAL- alert, co-operative, appears as stated age, not in any distress. HEENT- Atraumatic, normocephalic, PERRL, EOMI, oral mucosa appears moist, neck supple. CARDIAC- RRR, no murmurs, rubs or gallops. RESP- Moving equal volumes of air, and clear to auscultation bilaterally, no wheezes or crackles. ABDOMEN- Soft, nontender, no guarding or rebound, no palpable masses or organomegaly, bowel sounds present. BACK- Normal curvature of the spine, No tenderness along the vertebrae, no CVA tenderness, has a horizontal well healed incision under left rib cage posteriorly- Pt doesn't know what the surgery was done for, but was done in [place], before she came to the [place], no tenderness elicited on palpation of the back. NEURO- No obvious Cr N abnormality, normal strenght upper and lower extremities- 5/5, DTRs- Normal, Gait- Normal. EXTREMITIES- pulse 2+, symmetric, no pedal edema. SKIN- Warm, dry, No rash or lesion. PSYCH- Normal mood and affect, appropriate thought content and speech.  Assessment & Plan:  The patient's  case and plan of care was discussed with attending physician, Dr. Criselda Peaches  Please see problem based charting for assessment and plan.

## 2014-05-01 NOTE — Assessment & Plan Note (Addendum)
Likely from Chronic HTN.  Talked to pt about diagnosis of CKD and to avoid nephrotoxics- NSAIDS, also compliance with antihypertensives. Pt has been taking advil- Advised her she has to stop this meds.  -Plan- Cmet  Addendum- Results with stable CKD. - UA to further evaluate CKD, presence of Hgb in the past.

## 2014-05-01 NOTE — Assessment & Plan Note (Addendum)
Pt is on Lipitor-  daily. Last lipid profile- 07/2013- LDL- 337, total- 437, TG- 841. Pt says she is complaint with Lipitor-  daily. Is also working on reducing cholesterol content of the meals she takes.  Plan- Lipid profile check today.

## 2014-05-01 NOTE — Assessment & Plan Note (Signed)
BP Readings from Last 3 Encounters:  05/01/14 120/64  03/01/14 108/74  07/20/13 125/71    Lab Results  Component Value Date   NA 138 07/20/2013   K 3.4* 07/20/2013   CREATININE 1.36* 07/20/2013    Assessment: Blood pressure control:  Well controlled Progress toward BP goal:    Comments: Compliant with HCTZ-  daily  Plan: Medications:  Cont current meds. Other plans: Cmet to day

## 2014-05-01 NOTE — Assessment & Plan Note (Addendum)
Back pain- Chronic- Work related. 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 She says everyday, she has aching pain in her limbs- especially lower limbs and back from the hard work she does all day, also moving pts around. Pt denies- [place] pains from back, saddle anesthesia. Pt says Tramadol prescribed helps, but she ran out. Advil- OTC provided some relief. Pt has CKD3- NSAIDS should therefore be avoided. Pt says she dosent have time to do physical therapy as she works all day, most days.  Plan- Tramadol-  BID.  - Recess in a month, pt might need pain contract. -

## 2014-05-01 NOTE — Patient Instructions (Signed)
We will be checking your cholesterol level today. Also I will be prescribing you a a months supply of Tramadol.

## 2014-05-02 DIAGNOSIS — D649 Anemia, unspecified: Secondary | ICD-10-CM | POA: Insufficient documentation

## 2014-05-02 DIAGNOSIS — R740 Nonspecific elevation of levels of transaminase and lactic acid dehydrogenase [LDH]: Secondary | ICD-10-CM

## 2014-05-02 DIAGNOSIS — R7401 Elevation of levels of liver transaminase levels: Secondary | ICD-10-CM | POA: Insufficient documentation

## 2014-05-02 HISTORY — DX: Anemia, unspecified: D64.9

## 2014-05-02 LAB — COMPREHENSIVE METABOLIC PANEL
ALK PHOS: 37 U/L — AB (ref 39–117)
ALT: 33 U/L (ref 0–35)
AST: 76 U/L — ABNORMAL HIGH (ref 0–37)
Albumin: 5 g/dL (ref 3.5–5.2)
BILIRUBIN TOTAL: 0.4 mg/dL (ref 0.2–1.2)
BUN: 23 mg/dL (ref 6–23)
CO2: 27 meq/L (ref 19–32)
CREATININE: 1.57 mg/dL — AB (ref 0.50–1.10)
Calcium: 9.4 mg/dL (ref 8.4–10.5)
Chloride: 105 mEq/L (ref 96–112)
GLUCOSE: 67 mg/dL — AB (ref 70–99)
Potassium: 4.2 mEq/L (ref 3.5–5.3)
SODIUM: 142 meq/L (ref 135–145)
TOTAL PROTEIN: 7.8 g/dL (ref 6.0–8.3)

## 2014-05-02 LAB — LIPID PANEL
CHOL/HDL RATIO: 3.2 ratio
CHOLESTEROL: 154 mg/dL (ref 0–200)
HDL: 48 mg/dL (ref >39)
LDL CALC: 75 mg/dL (ref 0–99)
TRIGLYCERIDES: 156 mg/dL — AB (ref <150)
VLDL: 31 mg/dL (ref 0–40)

## 2014-05-02 NOTE — Addendum Note (Signed)
Addended by: Onnie Boer on: 05/02/2014 05:22 PM   Modules accepted: Orders

## 2014-05-02 NOTE — Addendum Note (Signed)
Addended by: Onnie Boer on: 05/02/2014 02:16 PM   Modules accepted: Orders

## 2014-05-02 NOTE — Assessment & Plan Note (Addendum)
05/02/2014- Pt complained to Lab staff that she feels tired all the time, and Lab staff felt blood looked on the light side in viscousity. Therefore we drew a CBC just before pt left. Results showed Hgb- 9.2, normocytic. But appears pt has been anemic for at least 6 years as per chart review. Further work up required. Called pt and got voice mail. Will find out about chronic blood loss- Post menopausal bleed or Gi blood loss. 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  Plan- Anemia panel  - Also ESR- as pt also has CKD- Cr- 1.57 today, which doesn't appear to be improving or getting worse, but with normal Ca, normal proteins (doubt Multiple Myeloma). - FOBT X3, though colonoscopy 2013- With findings of diverticulum and medium hemorrhoids, recs to repeat in 10 years. - Pending results will consider systemic cause, considering presence of elevated AST, hx of hyperlipidemia, and CKD.

## 2014-05-02 NOTE — Assessment & Plan Note (Addendum)
Cmet revealed an elevated AST- 76, normal ALT- 33 only, low ALP- 37. Pattern of alcoholic liver disease. Pt denies alcohol use, says she has never taken alcohol.  Plan-  - Hepatitis panel considering liver enzymes. Doubt Statin toxicity considering last Cmet- April 2013 was AST 38 before pt started taking statin meds- Aug 2012, also considering only AST is elevated. Will monitor for now, consider reducing dose of statin if Liver enzymes continue to increase. Will not reduce now, considering how high pt LDL was, without initial response to gradually increasing statin dose.  - will check CK for rhadbomyolysis.

## 2014-05-03 ENCOUNTER — Other Ambulatory Visit (INDEPENDENT_AMBULATORY_CARE_PROVIDER_SITE_OTHER): Payer: 59

## 2014-05-03 DIAGNOSIS — D649 Anemia, unspecified: Secondary | ICD-10-CM

## 2014-05-03 LAB — ANEMIA PANEL
%SAT: 23 % (ref 20–55)
ABS RETIC: 27.4 10*3/uL (ref 19.0–186.0)
Ferritin: 237 ng/mL (ref 10–291)
Folate: 17.5 ng/mL
IRON: 67 ug/dL (ref 42–145)
RBC.: 3.42 MIL/uL — ABNORMAL LOW (ref 3.87–5.11)
RETIC CT PCT: 0.8 % (ref 0.4–2.3)
TIBC: 295 ug/dL (ref 250–470)
UIBC: 228 ug/dL (ref 125–400)
Vitamin B-12: 686 pg/mL (ref 211–911)

## 2014-05-03 LAB — CK: Total CK: 1411 U/L — ABNORMAL HIGH (ref 7–177)

## 2014-05-03 LAB — SEDIMENTATION RATE: SED RATE: 23 mm/h — AB (ref 0–22)

## 2014-05-03 NOTE — Progress Notes (Signed)
Internal Medicine Clinic Attending Date of visit: 05/03/2014   Case discussed with Dr. Mariea Clonts soon after the resident saw the patient.  We reviewed the resident's history and exam and pertinent patient test results.  I agree with the assessment, diagnosis, and plan of care documented in the resident's note.

## 2014-05-03 NOTE — Addendum Note (Signed)
Addended by: Onnie Boer on: 05/03/2014 07:44 AM   Modules accepted: Orders

## 2014-05-04 LAB — URINALYSIS
Bilirubin Urine: NEGATIVE
Glucose, UA: NEGATIVE mg/dL
KETONES UR: NEGATIVE mg/dL
Leukocytes, UA: NEGATIVE
Nitrite: NEGATIVE
PH: 5 (ref 5.0–8.0)
Protein, ur: NEGATIVE mg/dL
Specific Gravity, Urine: 1.021 (ref 1.005–1.030)
UROBILINOGEN UA: 0.2 mg/dL (ref 0.0–1.0)

## 2014-05-04 LAB — HEPATITIS PANEL, ACUTE
HCV Ab: NEGATIVE
HEP B C IGM: NONREACTIVE
HEP B S AG: NEGATIVE
Hep A IgM: NONREACTIVE

## 2014-05-08 ENCOUNTER — Telehealth: Payer: Self-pay | Admitting: Internal Medicine

## 2014-05-08 ENCOUNTER — Other Ambulatory Visit (INDEPENDENT_AMBULATORY_CARE_PROVIDER_SITE_OTHER): Payer: 59

## 2014-05-08 DIAGNOSIS — D649 Anemia, unspecified: Secondary | ICD-10-CM

## 2014-05-08 LAB — POC HEMOCCULT BLD/STL (HOME/3-CARD/SCREEN)
Card #2 Fecal Occult Blod, POC: NEGATIVE
Card #3 Fecal Occult Blood, POC: NEGATIVE
FECAL OCCULT BLD: NEGATIVE

## 2014-05-08 NOTE — Telephone Encounter (Signed)
Tried twice to get in touch with patient on phone, got voice mail twice.  Traci Brown.

## 2014-05-11 ENCOUNTER — Telehealth: Payer: Self-pay | Admitting: Internal Medicine

## 2014-05-11 DIAGNOSIS — T466X5A Adverse effect of antihyperlipidemic and antiarteriosclerotic drugs, initial encounter: Principal | ICD-10-CM

## 2014-05-11 DIAGNOSIS — M6282 Rhabdomyolysis: Secondary | ICD-10-CM

## 2014-05-11 NOTE — Telephone Encounter (Signed)
Tried calling pt several times. Finally got pt today. Told pt to stop lipitor for now due to possible rhabdomyolysis, come back in 2 weeks for blood draw- Check CMET and CK- kidney function, if improving. As increased Cr and declining kidney function can also account for elevated Ck level.  Also counselled pt to avoid Advil, ibuprofen, aleve, naproxen, and stick with Tramadol, to preserve kidney function. Also explained that anemia is likely due to her declining kidney function.  Inocente Salles, MD [facility].

## 2014-05-28 ENCOUNTER — Ambulatory Visit: Payer: 59 | Admitting: Internal Medicine

## 2014-05-31 ENCOUNTER — Ambulatory Visit: Payer: 59 | Admitting: Internal Medicine

## 2014-06-04 ENCOUNTER — Ambulatory Visit (INDEPENDENT_AMBULATORY_CARE_PROVIDER_SITE_OTHER): Payer: 59 | Admitting: Internal Medicine

## 2014-06-04 ENCOUNTER — Telehealth: Payer: Self-pay | Admitting: Internal Medicine

## 2014-06-04 VITALS — BP 122/66 | HR 77 | Temp 98.0°F | Wt 195.6 lb

## 2014-06-04 DIAGNOSIS — I10 Essential (primary) hypertension: Secondary | ICD-10-CM

## 2014-06-04 DIAGNOSIS — N183 Chronic kidney disease, stage 3 unspecified: Secondary | ICD-10-CM

## 2014-06-04 DIAGNOSIS — T466X5A Adverse effect of antihyperlipidemic and antiarteriosclerotic drugs, initial encounter: Secondary | ICD-10-CM

## 2014-06-04 DIAGNOSIS — M6282 Rhabdomyolysis: Secondary | ICD-10-CM

## 2014-06-04 DIAGNOSIS — E785 Hyperlipidemia, unspecified: Secondary | ICD-10-CM

## 2014-06-04 LAB — COMPLETE METABOLIC PANEL WITH GFR
ALK PHOS: 50 U/L (ref 39–117)
ALT: 35 U/L (ref 0–35)
AST: 64 U/L — ABNORMAL HIGH (ref 0–37)
Albumin: 4.1 g/dL (ref 3.5–5.2)
BILIRUBIN TOTAL: 0.3 mg/dL (ref 0.3–1.2)
BUN: 20 mg/dL (ref 6–23)
CO2: 28 mEq/L (ref 19–32)
Calcium: 9 mg/dL (ref 8.4–10.5)
Chloride: 101 mEq/L (ref 96–112)
Creat: 1.29 mg/dL — ABNORMAL HIGH (ref 0.50–1.10)
GFR, EST NON AFRICAN AMERICAN: 46 mL/min — AB
GFR, Est African American: 53 mL/min — ABNORMAL LOW
Glucose, Bld: 74 mg/dL (ref 70–99)
Potassium: 3.7 mEq/L (ref 3.5–5.3)
SODIUM: 138 meq/L (ref 135–145)
Total Protein: 7.7 g/dL (ref 6.0–8.3)

## 2014-06-04 LAB — CK TOTAL AND CKMB (NOT AT ARMC)
CK, MB: 13.4 ng/mL — AB (ref 0.3–4.0)
Relative Index: 0.9 (ref 0.0–4.0)
Total CK: 1470 U/L — ABNORMAL HIGH (ref 7–177)

## 2014-06-04 NOTE — Assessment & Plan Note (Addendum)
Cmet today. Access kidney function.  Pt with Hgb- 9.2. Anemia panel, essentially normal, except for mild decrease in Retic- 3.42. Also CK levels today. Will refer to nephrology.

## 2014-06-04 NOTE — Patient Instructions (Addendum)
General Instructions:  We will be doing some blood tests today, if there is any abnormalities, we will let you know.  Continue to avoid ibuprofen, advil, alieve, motrin or medication like that. Take only tramadol for pain.  Please bring your medicines with you each time you come to clinic.  Medicines may include prescription medications, over-the-counter medications, herbal remedies, eye drops, vitamins, or other pills.    Cholesterol Cholesterol is a white, waxy, fat-like substance needed by your body in small amounts. The liver makes all the cholesterol you need. Cholesterol is carried from the liver by the blood through the blood vessels. Deposits of cholesterol (plaque) may build up on blood vessel walls. These make the arteries narrower and stiffer. Cholesterol plaques increase the risk for heart attack and stroke.  You cannot feel your cholesterol level even if it is very high. The only way to know it is high is with a blood test. Once you know your cholesterol levels, you should keep a record of the test results. Work with your health care provider to keep your levels in the desired range.  WHAT DO THE RESULTS MEAN?  Total cholesterol is a rough measure of all the cholesterol in your blood.   LDL is the so-called bad cholesterol. This is the type that deposits cholesterol in the walls of the arteries. You want this level to be low.   HDL is the good cholesterol because it cleans the arteries and carries the LDL away. You want this level to be high.  Triglycerides are fat that the body can either burn for energy or store. High levels are closely linked to heart disease.  WHAT ARE THE DESIRED LEVELS OF CHOLESTEROL?  Total cholesterol below 200.   LDL below 100 for people at risk, below 70 for those at very high risk.   HDL above 50 is good, above 60 is best.   Triglycerides below 150.  HOW CAN I LOWER MY CHOLESTEROL?  Diet. Follow your diet programs as directed by your  health care provider.   Choose fish or white meat chicken and [place], roasted or baked. Limit fatty cuts of red meat, fried foods, and processed meats, such as sausage and lunch meats.   Eat lots of fresh fruits and vegetables.  Choose whole grains, beans, pasta, potatoes, and cereals.   Use only small amounts of olive, corn, or canola oils.   Avoid butter, mayonnaise, shortening, or palm kernel oils.  Avoid foods with trans fats.   Drink skim or nonfat milk and eat low-fat or nonfat yogurt and cheeses. Avoid whole milk, cream, ice cream, egg yolks, and full-fat cheeses.   Healthy desserts include angel food cake, ginger snaps, animal crackers, hard candy, popsicles, and low-fat or nonfat frozen yogurt. Avoid pastries, cakes, pies, and cookies.   Exercise. Follow your exercise programs as directed by your health care provider.   A regular program helps decrease LDL and raise HDL.   A regular program helps with weight control.   Do things that increase your activity level like gardening, walking, or taking the stairs. Ask your health care provider about how you can be more active in your daily life.   Medicine. Take medicine only as directed by your health care provider.   Medicine may be prescribed by your health care provider to help lower cholesterol and decrease the risk for heart disease.   If you have several risk factors, you may need medicine even if your levels are normal.

## 2014-06-04 NOTE — Progress Notes (Signed)
Patient ID: Traci Brown, female   DOB: 05-17-1956, 58 y.o.   MRN: [id number]   Subjective:   Patient ID: Traci Brown female   DOB: July 20, 1956 58 y.o.   MRN: [id number]  HPI: [name] is a 58 y.o. with PMH listed below, presented for follow up visit today. No complaints, complaint with her Bp meds.  Past Medical History  Diagnosis Date  . High cholesterol   . Hypertension 07/08/2012  . Leg cramps 04/15/2011    Pt was having cramps in the thigh   . Left-sided chest wall pain 07/08/2012  . CKD (chronic kidney disease) stage 3, GFR 30-59 ml/min 07/08/2012   Current Outpatient Prescriptions  Medication Sig Dispense Refill  . atorvastatin (LIPITOR) 80 MG tablet Take 1 tablet (80 mg total) by mouth daily.  30 tablet  11  . hydrochlorothiazide (HYDRODIURIL) 25 MG tablet Take 1 tablet (25 mg total) by mouth daily.  30 tablet  3  . traMADol (ULTRAM) 50 MG tablet Take 1 tablet (50 mg total) by mouth every 8 (eight) hours as needed.  60 tablet  0   No current facility-administered medications for this visit.   Family History  Problem Relation Age of Onset  . Hypertension Mother   . Heart disease Neg Hx    History   Social History  . Marital Status: Single    Spouse Name: N/A    Number of Children: N/A  . Years of Education: N/A   Occupational History  . 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 Smoking status: Never Smoker   . Smokeless tobacco: Not on file  . Alcohol Use: No  . Drug Use: No  . Sexual Activity: No   Other Topics Concern  . Not on file   Social History Narrative  . No narrative on file   Review of Systems: CONSTITUTIONAL- No Fever, weightloss, night sweat or change in appetite. SKIN- No Rash, colour changes or itching. HEAD- No Headache or dizziness. Mouth/throat- No Sorethroat, dentures, or bleeding gums. RESPIRATORY- No Cough or SOB. CARDIAC- No Palpitations, DOE, PND or chest pain. GI- No nausea, vomiting,  diarrhoea, constipation, abd pain. URINARY- No Frequency, urgency, straining or dysuria. NEUROLOGIC- No Numbness, syncope, seizures or burning. [facility]- Denies depression or anxiety.  Objective:  Physical Exam: Filed Vitals:   06/04/14 1607  BP: 122/66  Pulse: 77  Temp: 98 F (36.7 C)  TempSrc: Oral  Weight: 195 lb 9.6 oz (88.724 kg)  SpO2: 99%   GENERAL- alert, co-operative, appears as stated age, not in any distress. HEENT- Atraumatic, normocephalic, PERRL, EOMI, oral mucosa appears moist, no cervical LN enlargement, thyroid does not appear enlarged, neck supple. CARDIAC- RRR, no murmurs, rubs or gallops. RESP- Moving equal volumes of air, and clear to auscultation bilaterally, no wheezes or crackles. ABDOMEN- Soft, nontender, no guarding or rebound, no palpable masses or organomegaly, bowel sounds present. NEURO- No obvious Cr N abnormality, strenght upper and lower extremities- 5/5, Sensation intact- globally, DTRs- Normal, finger to nose test normal bilat, rapid alternating movement- intact, Gait- Normal. EXTREMITIES- pulse 2+, symmetric, no pedal edema. SKIN- Warm, dry, No rash or lesion. PSYCH- Normal mood and affect, appropriate thought content and speech.  Assessment & Plan:   The patient's case and plan of care was discussed with attending physician, Dr. Heide Spark.  Please see problem based charting for assessment and plan.

## 2014-06-04 NOTE — Progress Notes (Signed)
INTERNAL MEDICINE TEACHING ATTENDING ADDENDUM - Jalicia Roszak, MD: I reviewed and discussed at the time of visit with the resident Dr. Emokpae, the patient's medical history, physical examination, diagnosis and results of pertinent tests and treatment and I agree with the patient's care as documented.  

## 2014-06-04 NOTE — Telephone Encounter (Signed)
CK MB is 13.4 and range is 4.0.  CK total 1417 (range 7-177). Creatinine 1.29.  AST 64. Will notify pt.   Shirlee Latch MD

## 2014-06-04 NOTE — Assessment & Plan Note (Signed)
BP Readings from Last 3 Encounters:  06/04/14 122/66  05/01/14 120/64  03/01/14 108/74    Lab Results  Component Value Date   NA 142 05/01/2014   K 4.2 05/01/2014   CREATININE 1.57* 05/01/2014    Assessment: Blood pressure control:  Controlled Progress toward BP goal:   At goal Comments: Complaint with HCTZ-  daily.  Plan: Medications:  continue current medications Educational resources provided:   Self management tools provided:   Other plans: Cmet with GFR today.

## 2014-06-04 NOTE — Assessment & Plan Note (Addendum)
Will restart statin pending results of labs. CK today. Pt reports improvement in body aches since stopping statin for a month now. Consider starting pravastatin- at low dose. Also dietary discretion when it comes to low cholesterol diet. Educational materials given on diet.  Addendum- 06/05/2014- CK-MB still elevated- CK- 1470, but also elevated at CK-MB- 13.4, but Ratio- 0.9. This ratio suggests that elevated CK-MB is not concerning for cardiac etiology. As pt is currently also completely asymptomatic at this time for any concerning cardiac condition, will continue to monitor. Cause of elevated CK still not clear.  Addendum- Called pt to confirm that she has not been taking her Lipitor for at least a month and she confirms she stopped it. Pt feels better overall, and says the body aches/mayalgias she had has stopped since she stopped taking the drug.  Pt is a transporter and exertion at this time is the only etiology I can identify as a cause of her elevated CK, or poor clearance of CK with kidney function. Will Continue to hold statin for now. Pt strongly advised on Diet and exercise adherence.  Can recheck at next visit- 3months, if CK is still elevated or trending up. Also consider checking lipid panel next visit. Considering pts calculated risk of >10% pt should be on a mod- high intensity statin.  If statin should be restarted Consider starting at a very low dose.

## 2014-06-05 LAB — URINALYSIS, ROUTINE W REFLEX MICROSCOPIC
Bilirubin Urine: NEGATIVE
Glucose, UA: NEGATIVE mg/dL
Ketones, ur: NEGATIVE mg/dL
Leukocytes, UA: NEGATIVE
NITRITE: NEGATIVE
PH: 5 (ref 5.0–8.0)
Protein, ur: NEGATIVE mg/dL
Specific Gravity, Urine: 1.023 (ref 1.005–1.030)
Urobilinogen, UA: 0.2 mg/dL (ref 0.0–1.0)

## 2014-06-05 LAB — URINALYSIS, MICROSCOPIC ONLY
CASTS: NONE SEEN
Squamous Epithelial / LPF: NONE SEEN

## 2014-06-12 ENCOUNTER — Encounter: Payer: 59 | Admitting: Internal Medicine

## 2014-06-22 ENCOUNTER — Other Ambulatory Visit: Payer: Self-pay

## 2014-08-27 ENCOUNTER — Other Ambulatory Visit: Payer: Self-pay | Admitting: Internal Medicine

## 2014-08-27 DIAGNOSIS — Z1231 Encounter for screening mammogram for malignant neoplasm of breast: Secondary | ICD-10-CM

## 2014-09-11 ENCOUNTER — Ambulatory Visit (HOSPITAL_COMMUNITY)
Admission: RE | Admit: 2014-09-11 | Discharge: 2014-09-11 | Disposition: A | Discharge status: Home / Self Care | Payer: 59 | Source: Ambulatory Visit | Attending: Internal Medicine | Admitting: Internal Medicine

## 2014-09-11 DIAGNOSIS — Z1231 Encounter for screening mammogram for malignant neoplasm of breast: Secondary | ICD-10-CM | POA: Insufficient documentation

## 2014-09-20 ENCOUNTER — Encounter: Payer: 59 | Admitting: Internal Medicine

## 2014-10-09 ENCOUNTER — Ambulatory Visit (INDEPENDENT_AMBULATORY_CARE_PROVIDER_SITE_OTHER): Payer: 59 | Admitting: Internal Medicine

## 2014-10-09 ENCOUNTER — Encounter: Payer: Self-pay | Admitting: Internal Medicine

## 2014-10-09 VITALS — BP 136/71 | HR 81 | Temp 98.2°F | Ht 69.0 in | Wt 200.3 lb

## 2014-10-09 DIAGNOSIS — I129 Hypertensive chronic kidney disease with stage 1 through stage 4 chronic kidney disease, or unspecified chronic kidney disease: Secondary | ICD-10-CM

## 2014-10-09 DIAGNOSIS — E785 Hyperlipidemia, unspecified: Secondary | ICD-10-CM

## 2014-10-09 DIAGNOSIS — M6282 Rhabdomyolysis: Secondary | ICD-10-CM

## 2014-10-09 DIAGNOSIS — N183 Chronic kidney disease, stage 3 (moderate): Secondary | ICD-10-CM

## 2014-10-09 DIAGNOSIS — I10 Essential (primary) hypertension: Secondary | ICD-10-CM

## 2014-10-09 DIAGNOSIS — D631 Anemia in chronic kidney disease: Secondary | ICD-10-CM

## 2014-10-09 DIAGNOSIS — T466X5A Adverse effect of antihyperlipidemic and antiarteriosclerotic drugs, initial encounter: Secondary | ICD-10-CM

## 2014-10-09 DIAGNOSIS — D72819 Decreased white blood cell count, unspecified: Secondary | ICD-10-CM

## 2014-10-09 DIAGNOSIS — N189 Chronic kidney disease, unspecified: Secondary | ICD-10-CM

## 2014-10-09 DIAGNOSIS — D649 Anemia, unspecified: Secondary | ICD-10-CM

## 2014-10-09 DIAGNOSIS — M79651 Pain in right thigh: Secondary | ICD-10-CM

## 2014-10-09 HISTORY — DX: Rhabdomyolysis: M62.82

## 2014-10-09 LAB — COMPLETE METABOLIC PANEL WITH GFR
ALK PHOS: 50 U/L (ref 39–117)
ALT: 25 U/L (ref 0–35)
AST: 50 U/L — ABNORMAL HIGH (ref 0–37)
Albumin: 3.8 g/dL (ref 3.5–5.2)
BILIRUBIN TOTAL: 0.4 mg/dL (ref 0.2–1.2)
BUN: 16 mg/dL (ref 6–23)
CALCIUM: 9.5 mg/dL (ref 8.4–10.5)
CO2: 28 meq/L (ref 19–32)
CREATININE: 1.28 mg/dL — AB (ref 0.50–1.10)
Chloride: 104 mEq/L (ref 96–112)
GFR, EST AFRICAN AMERICAN: 54 mL/min — AB
GFR, Est Non African American: 47 mL/min — ABNORMAL LOW
Glucose, Bld: 72 mg/dL (ref 70–99)
Potassium: 3.7 mEq/L (ref 3.5–5.3)
Sodium: 141 mEq/L (ref 135–145)
Total Protein: 6.9 g/dL (ref 6.0–8.3)

## 2014-10-09 LAB — LIPID PANEL
Cholesterol: 344 mg/dL — ABNORMAL HIGH (ref 0–200)
HDL: 29 mg/dL — ABNORMAL LOW (ref >39)
Total CHOL/HDL Ratio: 11.9 Ratio
Triglycerides: 510 mg/dL — ABNORMAL HIGH (ref <150)

## 2014-10-09 LAB — CBC
HCT: 28 % — ABNORMAL LOW (ref 36.0–46.0)
Hemoglobin: 9.2 g/dL — ABNORMAL LOW (ref 12.0–15.0)
MCH: 27.1 pg (ref 26.0–34.0)
MCHC: 32.9 g/dL (ref 30.0–36.0)
MCV: 82.6 fL (ref 78.0–100.0)
MPV: 10 fL (ref 8.6–12.4)
Platelets: 200 10*3/uL (ref 150–400)
RBC: 3.39 MIL/uL — AB (ref 3.87–5.11)
RDW: 15.5 % (ref 11.5–15.5)
WBC: 2.8 10*3/uL — ABNORMAL LOW (ref 4.0–10.5)

## 2014-10-09 LAB — CK: CK TOTAL: 586 U/L — AB (ref 7–177)

## 2014-10-09 MED ORDER — DICLOFENAC SODIUM 1 % TD GEL: 4.0000 g | Freq: Four times a day (QID) | TRANSDERMAL | Status: DC

## 2014-10-09 NOTE — Progress Notes (Signed)
Patient ID: Traci Brown, female   DOB: 1955-12-15, 59 y.o.   MRN: [id number]   Subjective:   Patient ID: Traci Brown female   DOB: 1955-12-15 59 y.o.   MRN: [id number]  HPI: [name] is a 59 y.o. with PMH listed below. Presented for routine visit, with complaints of right thigh pain.  Past Medical History  Diagnosis Date  . High cholesterol   . Hypertension 07/08/2012  . Leg cramps 04/15/2011    Pt was having cramps in the thigh   . Left-sided chest wall pain 07/08/2012  . CKD (chronic kidney disease) stage 3, GFR 30-59 ml/min 07/08/2012   Current Outpatient Prescriptions  Medication Sig Dispense Refill  . diclofenac sodium (VOLTAREN) 1 % GEL Apply 4 g topically 4 (four) times daily. 1 Tube 1  . hydrochlorothiazide (HYDRODIURIL) 25 MG tablet Take 1 tablet (25 mg total) by mouth daily. 30 tablet 3  . traMADol (ULTRAM) 50 MG tablet Take 1 tablet (50 mg total) by mouth every 8 (eight) hours as needed. 60 tablet 0   No current facility-administered medications for this visit.   Family History  Problem Relation Age of Onset  . Hypertension Mother   . Heart disease Neg Hx    History   Social History  . Marital Status: Single    Spouse Name: N/A    Number of Children: N/A  . Years of Education: N/A   Occupational History  . 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 Smoking status: Never Smoker   . Smokeless tobacco: None  . Alcohol Use: No  . Drug Use: No  . Sexual Activity: No   Other Topics Concern  . None   Social History Narrative   Review of Systems: CONSTITUTIONAL- No Fever, weightloss, night sweat or change in appetite. SKIN- No Rash, colour changes or itching. HEAD- No Headache or dizziness. RESPIRATORY- No Cough or SOB. CARDIAC- No Palpitations, DOE, PND or chest pain. GI- No nausea, vomiting, diarrhoea, constipation, abd pain. URINARY- No Frequency, urgency, straining or dysuria. NEUROLOGIC- No Numbness, syncope,  seizures or burning. [facility] Den[facility] depression or anxiety.  Objective:  Physical Exam: Filed Vitals:   10/09/14 1534  BP: 136/71  Pulse: 81  Temp: 98.2 F (36.8 C)  TempSrc: Oral  Height: 5\' 9"  (1.753 m)  Weight: 200 lb 4.8 oz (90.855 kg)  SpO2: 100%   GENERAL- alert, co-operative, appears as stated age, not in any distress. HEENT- Atraumatic, normocephalic, PERRL  oral mucosa appears moist,  no cervical LN enlargement, thyroid does not appear enlarged, neck supple. CARDIAC- RRR, no murmurs, rubs or gallops. RESP- Moving equal volumes of air, and clear to auscultation bilaterally, no wheezes or crackles. ABDOMEN- Soft, nontender, no guarding or rebound, no palpable masses or organomegaly, bowel sounds present. NEURO- No obvious Cr N abnormality, strenght upper and lower extremities- 5/5,  Gait- Normal. EXTREMITIES- pulse 2+, symmetric, no pedal edema. Right thigh- No tenderness to palpation, no tenderness, swelling or erythema of the right knee, no pain with movement, normal range pf motion. No pain on rotation of the thigh, medially or laterally, no pain with flexion of the hip joint. SKIN- Warm, dry, No rash or lesion. PSYCH- Normal mood and affect, appropriate thought content and speech.  Assessment & Plan:   The patient's case and plan of care was discussed with attending physician, Dr. Criselda PeachesMullen.  Please see problem based charting for assessment and plan.

## 2014-10-09 NOTE — Assessment & Plan Note (Addendum)
HAs been off statins- since 04/2014. Pt says he has been adhering to low fat diet.   Plan- Check lipid panel today. - Ck today, if reduced then stain myopathy most likely cause of elevated CK.  - Consider Fibrates, if LDL and Total chol is markedly elevated.    Addendum- 10/16/2013- Lipid panel- Total chol- 344, TG- 510, HDL- 29, LDL- 187.  As patient did not tolerate statins, and had significant myopathy which is improving now- 586 10/09/2013 from 1411 5 months ago, Will start patient on Gemfibrozil- 600mg  BID. Recheck cholesterol in 3 months. recs by UTD, if no response, discont.  - called patient she agreed with plan, explained she has to take it 30mins before breakfast and dinner.

## 2014-10-09 NOTE — Assessment & Plan Note (Addendum)
Pt with complainst of pain today- lateral side of right thigh, burning, present all the time. Worse with weight bearing. Relieved by non weight bearing. No fever, anorexia. Or weight loss. Pt says pain sometimes wakes her up at night. Says she kind of has pain in her hip that moves down the lateral side of her thighs to her knees. Tramadol has not helped with pain. Exam- Not remarkable, on rotating hip medially and laterally, pat denies an pain, flexion of hips and knees, pt endorses pain on the lateral side of her thigh.   Hx is suggestive of hip pathology, as pain is worse with weight bearing, and better when lying down, but physical exam is not supportive, might be referred pain from hip to knee.  For now Differential includes- Meralgia Paraesthetica, and trochanteric bursitis- but area involved today is more diffuse and lower down than would be expected.   Plan- Avoiding NSAIDS due to CKD3, Hesistant to give Opioid meds.  - For now Voltaren gel and Physical therapy. - Pt cautioned that if these do not work or pain is getting worse, would consider imaging- Xray hip and/or knee.

## 2014-10-09 NOTE — Patient Instructions (Signed)
General Instructions:  For your thigh pain- We will prescribe a gel for you. Apply this 4 times a day and lets see how you do. If you are still having pain, we will get an xray of your Hip and if that is normal, we will get an xray of your knee. For now apply the gel. We also feel that physical therapy might help. We will give you an appointment for 3 months. By then you will be due for your PAP smear.  If you are having any problems before then or the pain is getting worse of Unbearable, call us and schedule an appointment to be seen.   Please bring your medicines with you each time you come to clinic.  Medicines may include prescription medications, over-the-counter medications, herbal remedies, eye drops, vitamins, or other pills.

## 2014-10-09 NOTE — Assessment & Plan Note (Signed)
CBC today.  

## 2014-10-09 NOTE — Assessment & Plan Note (Addendum)
BP Readings from Last 3 Encounters:  10/09/14 136/71  06/04/14 122/66  05/01/14 120/64    Lab Results  Component Value Date   NA 138 06/04/2014   K 3.7 06/04/2014   CREATININE 1.29* 06/04/2014    Assessment: Blood pressure control:  Uncontrolled Progress toward BP goal:   At goal Comments: ON HCTZ 25. Compliant.  Plan: Medications:  continue current medications Educational resources provided: handout Self management tools provided:   Other plans: See in 3 months. - CMET today

## 2014-10-09 NOTE — Assessment & Plan Note (Addendum)
Pt CK- elevated 1411- 05/03/2014. Pt was on lipitor 80mg . No priors to compare. Also with elevated AST- 76, trace Hgb in urine. Lipitor was stopped. Pt Ck rechecked- still elevated- 1470- 06/04/2014, one month later. Probably checked too soon after stopping Lipitor. [place] enodorses a lot of walking around most days, for most of the day, so might have some baseline line muscle injury already from exertion.   Plan- CK check today - UA     Notes by Inocente SallesEjiro Mabell Esguerra, MD- 05/01/2014.   Cmet revealed an elevated AST- 76, normal ALT- 33 only, low ALP- 37. Pattern of alcoholic liver disease. Pt denies alcohol use, says she has never taken alcohol.  Plan-  - Hepatitis panel considering liver enzymes. Doubt Statin toxicity considering last Cmet- April 2013 was AST 38 before pt started taking statin meds- Aug 2012, also considering only AST is elevated. Will monitor for now, consider reducing dose of statin if Liver enzymes continue to increase. Will not reduce now, considering how high pt LDL was, without initial response to gradually increasing statin dose.  - will check CK for rhadbomyolysis.

## 2014-10-10 ENCOUNTER — Other Ambulatory Visit: Payer: 59

## 2014-10-10 NOTE — Addendum Note (Signed)
Addended by: Onnie BoerEMOKPAE, Ever Gustafson E on: 10/10/2014 05:52 PM   Modules accepted: Orders

## 2014-10-10 NOTE — Progress Notes (Signed)
Internal Medicine Clinic Attending  Case discussed with Dr. Emokpae soon after the resident saw the patient.  We reviewed the resident's history and exam and pertinent patient test results.  I agree with the assessment, diagnosis, and plan of care documented in the resident's note. 

## 2014-10-11 LAB — URINALYSIS, ROUTINE W REFLEX MICROSCOPIC
Bilirubin Urine: NEGATIVE
GLUCOSE, UA: NEGATIVE mg/dL
HGB URINE DIPSTICK: NEGATIVE
Ketones, ur: NEGATIVE mg/dL
Leukocytes, UA: NEGATIVE
NITRITE: NEGATIVE
PROTEIN: NEGATIVE mg/dL
Specific Gravity, Urine: 1.021 (ref 1.005–1.030)
Urobilinogen, UA: 0.2 mg/dL (ref 0.0–1.0)
pH: 5 (ref 5.0–8.0)

## 2014-10-11 LAB — LDL CHOLESTEROL, DIRECT: LDL DIRECT: 187 mg/dL — AB

## 2014-10-16 DIAGNOSIS — D72819 Decreased white blood cell count, unspecified: Secondary | ICD-10-CM

## 2014-10-16 HISTORY — DX: Decreased white blood cell count, unspecified: D72.819

## 2014-10-16 MED ORDER — GEMFIBROZIL 600 MG PO TABS: 600.0000 mg | ORAL_TABLET | Freq: Two times a day (BID) | ORAL | Status: DC

## 2014-10-16 NOTE — Assessment & Plan Note (Addendum)
Patient recent CBC- 10/09/2013- showed WBC- 2.8.  WBC  4.2- 05/01/2014.  WBC  1.8- 07/15/2007.  Appears this is A chronic issue for patient and unique to patient, more common in people of African descent ( Neutropenia)  Plan- Please follow up on HIV check next visit.  - CBC with differential next visit.

## 2014-10-16 NOTE — Addendum Note (Signed)
Addended by: Onnie BoerEMOKPAE, Kaylie Ritter E on: 10/16/2014 05:19 PM   Modules accepted: Orders

## 2014-11-07 ENCOUNTER — Encounter: Payer: Self-pay | Admitting: *Deleted

## 2014-12-05 NOTE — Addendum Note (Signed)
Addended by: Neomia DearPOWERS, Makana Rostad E on: 12/05/2014 02:38 PM   Modules accepted: Orders

## 2015-01-10 ENCOUNTER — Other Ambulatory Visit (HOSPITAL_COMMUNITY): Payer: Self-pay | Admitting: Orthopedic Surgery

## 2015-01-10 ENCOUNTER — Ambulatory Visit: Payer: Self-pay | Admitting: Certified Nurse Midwife

## 2015-01-10 DIAGNOSIS — M25551 Pain in right hip: Secondary | ICD-10-CM

## 2015-01-24 ENCOUNTER — Ambulatory Visit (HOSPITAL_COMMUNITY)
Admission: RE | Admit: 2015-01-24 | Discharge: 2015-01-24 | Disposition: A | Discharge status: Home / Self Care | Payer: 59 | Source: Ambulatory Visit | Attending: Orthopedic Surgery | Admitting: Orthopedic Surgery

## 2015-01-24 DIAGNOSIS — M79604 Pain in right leg: Secondary | ICD-10-CM | POA: Diagnosis not present

## 2015-01-24 DIAGNOSIS — M25551 Pain in right hip: Secondary | ICD-10-CM | POA: Diagnosis present

## 2015-01-24 DIAGNOSIS — M4806 Spinal stenosis, lumbar region: Secondary | ICD-10-CM | POA: Insufficient documentation

## 2015-01-24 DIAGNOSIS — M5116 Intervertebral disc disorders with radiculopathy, lumbar region: Secondary | ICD-10-CM | POA: Insufficient documentation

## 2015-01-24 DIAGNOSIS — M479 Spondylosis, unspecified: Secondary | ICD-10-CM | POA: Diagnosis not present

## 2015-01-24 LAB — POCT I-STAT CREATININE: CREATININE: 1.3 mg/dL — AB (ref 0.44–1.00)

## 2015-01-24 MED ORDER — GADOBENATE DIMEGLUMINE 529 MG/ML IV SOLN: 17.0000 mL | Freq: Once | INTRAVENOUS | Status: AC | PRN

## 2015-02-07 ENCOUNTER — Ambulatory Visit: Payer: Self-pay | Admitting: Certified Nurse Midwife

## 2015-05-31 ENCOUNTER — Emergency Department (HOSPITAL_COMMUNITY): Payer: 59

## 2015-05-31 ENCOUNTER — Emergency Department (HOSPITAL_COMMUNITY)
Admission: EM | Admit: 2015-05-31 | Discharge: 2015-05-31 | Disposition: A | Discharge status: Home / Self Care | Payer: 59 | Attending: Emergency Medicine | Admitting: Emergency Medicine

## 2015-05-31 ENCOUNTER — Encounter (HOSPITAL_COMMUNITY): Payer: Self-pay | Admitting: *Deleted

## 2015-05-31 DIAGNOSIS — I471 Supraventricular tachycardia: Secondary | ICD-10-CM | POA: Diagnosis not present

## 2015-05-31 DIAGNOSIS — Z79899 Other long term (current) drug therapy: Secondary | ICD-10-CM | POA: Diagnosis not present

## 2015-05-31 DIAGNOSIS — I129 Hypertensive chronic kidney disease with stage 1 through stage 4 chronic kidney disease, or unspecified chronic kidney disease: Secondary | ICD-10-CM | POA: Diagnosis not present

## 2015-05-31 DIAGNOSIS — Z8639 Personal history of other endocrine, nutritional and metabolic disease: Secondary | ICD-10-CM | POA: Diagnosis not present

## 2015-05-31 DIAGNOSIS — N183 Chronic kidney disease, stage 3 (moderate): Secondary | ICD-10-CM | POA: Diagnosis not present

## 2015-05-31 DIAGNOSIS — R531 Weakness: Secondary | ICD-10-CM | POA: Diagnosis present

## 2015-05-31 LAB — BASIC METABOLIC PANEL
Anion gap: 9 (ref 5–15)
BUN: 16 mg/dL (ref 6–20)
CHLORIDE: 105 mmol/L (ref 101–111)
CO2: 24 mmol/L (ref 22–32)
Calcium: 9.3 mg/dL (ref 8.9–10.3)
Creatinine, Ser: 1.22 mg/dL — ABNORMAL HIGH (ref 0.44–1.00)
GFR calc Af Amer: 56 mL/min — ABNORMAL LOW (ref >60)
GFR calc non Af Amer: 48 mL/min — ABNORMAL LOW (ref >60)
Glucose, Bld: 157 mg/dL — ABNORMAL HIGH (ref 65–99)
POTASSIUM: 3.8 mmol/L (ref 3.5–5.1)
Sodium: 138 mmol/L (ref 135–145)

## 2015-05-31 LAB — I-STAT TROPONIN, ED: Troponin i, poc: 0.01 ng/mL (ref 0.00–0.08)

## 2015-05-31 LAB — CBC
HEMATOCRIT: 30.8 % — AB (ref 36.0–46.0)
Hemoglobin: 9.7 g/dL — ABNORMAL LOW (ref 12.0–15.0)
MCH: 27 pg (ref 26.0–34.0)
MCHC: 31.5 g/dL (ref 30.0–36.0)
MCV: 85.8 fL (ref 78.0–100.0)
Platelets: 191 10*3/uL (ref 150–400)
RBC: 3.59 MIL/uL — ABNORMAL LOW (ref 3.87–5.11)
RDW: 14.6 % (ref 11.5–15.5)
WBC: 2.7 10*3/uL — ABNORMAL LOW (ref 4.0–10.5)

## 2015-05-31 LAB — MAGNESIUM: Magnesium: 2.2 mg/dL (ref 1.7–2.4)

## 2015-05-31 LAB — D-DIMER, QUANTITATIVE: D-Dimer, Quant: 0.53 ug/mL-FEU — ABNORMAL HIGH (ref 0.00–0.48)

## 2015-05-31 MED ORDER — METOPROLOL TARTRATE 25 MG PO TABS: 25.0000 mg | ORAL_TABLET | Freq: Every day | ORAL | Status: DC

## 2015-05-31 MED ORDER — IOHEXOL 350 MG/ML SOLN
100.0000 mL | Freq: Once | INTRAVENOUS | Status: AC | PRN
  Administered 2015-05-31: 80 mL via INTRAVENOUS

## 2015-05-31 MED ORDER — SODIUM CHLORIDE 0.9 % IV BOLUS (SEPSIS)
1000.0000 mL | Freq: Once | INTRAVENOUS | Status: AC
  Administered 2015-05-31: 1000 mL via INTRAVENOUS

## 2015-05-31 MED ORDER — METOPROLOL TARTRATE 25 MG PO TABS
25.0000 mg | ORAL_TABLET | Freq: Once | ORAL | Status: AC
  Administered 2015-05-31: 25 mg via ORAL
  Filled 2015-05-31: qty 1

## 2015-05-31 NOTE — Discharge Instructions (Signed)
Supraventricular Tachycardia °Supraventricular tachycardia (SVT) is an abnormal heart rhythm (arrhythmia) that causes the heart to beat very fast (tachycardia). This kind of fast heartbeat originates in the upper chambers of the heart (atria). SVT can cause the heart to beat greater than 100 beats per minute. SVT can have a rapid burst of heartbeats. This can start and stop suddenly without warning and is called nonsustained. SVT can also be sustained, in which the heart beats at a continuous fast rate.  °CAUSES  °There can be different causes of SVT. Some of these include: °· Heart valve problems such as mitral valve prolapse. °· An enlarged heart (hypertrophic cardiomyopathy). °· Congenital heart problems. °· Heart inflammation (pericarditis). °· Hyperthyroidism. °· Low potassium or magnesium levels. °· Caffeine. °· Drug use such as cocaine, methamphetamines, or stimulants. °· Some over-the-counter medicines such as: °¨ Decongestants. °¨ Diet medicines. °¨ Herbal medicines. °SYMPTOMS  °Symptoms of SVT can vary. Symptoms depend on whether the SVT is sustained or nonsustained. You may experience: °· No symptoms (asymptomatic). °· An awareness of your heart beating rapidly (palpitations). °· Shortness of breath. °· Chest pain or pressure. °If your blood pressure drops because of the SVT, you may experience: °· Fainting or near fainting. °· Weakness. °· Dizziness. °DIAGNOSIS  °Different tests can be performed to diagnose SVT, such as: °· An electrocardiogram (EKG). This is a painless test that records the electrical activity of your heart. °· Holter monitor. This is a 24 hour recording of your heart rhythm. You will be given a diary. Write down all symptoms that you have and what you were doing at the time you experienced symptoms. °· Arrhythmia monitor. This is a small device that your wear for several weeks. It records the heart rhythm when you have symptoms. °· Echocardiogram. This is an imaging test to help detect  abnormal heart structure such as congenital abnormalities, heart valve problems, or heart enlargement. °· Stress test. This test can help determine if the SVT is related to exercise. °· Electrophysiology study (EPS). This is a procedure that evaluates your heart's electrical system and can help your caregiver find the cause of your SVT. °TREATMENT  °Treatment of SVT depends on the symptoms, how often it recurs, and whether there are any underlying heart problems.  °· If symptoms are rare and no other cardiac disease is present, no treatment may be needed. °· Blood work may be done to check potassium, magnesium, and thyroid hormone levels to see if they are abnormal. If these levels are abnormal, treatment to correct the problems will occur. °Medicines °Your caregiver may use oral medicines to treat SVT. These medicines are given for long-term control of SVT. Medicines may be used alone or in combination with other treatments. These medicines work to slow nerve impulses in the heart muscle. These medicines can also be used to treat high blood pressure. Some of these medicines may include: °· Calcium channel blockers. °· Beta blockers. °· Digoxin. °Nonsurgical procedures °Nonsurgical techniques may be used if oral medicines do not work. Some examples include: °· Cardioversion. This technique uses either drugs or an electrical shock to restore a normal heart rhythm. °¨ Cardioversion drugs may be given through an intravenous (IV) line to help "reset" the heart rhythm. °¨ In electrical cardioversion, the caregiver shocks your heart to stop its beat for a split second. This helps to reset the heart to a normal rhythm. °· Ablation. This procedure is done under mild sedation. High frequency radio wave energy is used to   destroy the area of heart tissue responsible for the SVT. °HOME CARE INSTRUCTIONS  °· Do not smoke. °· Only take medicines prescribed by your caregiver. Check with your caregiver before using over-the-counter  medicines. °· Check with your caregiver about how much alcohol and caffeine (coffee, tea, colas, or chocolate) you may have. °· It is very important to keep all follow-up referrals and appointments in order to properly manage this problem. °SEEK IMMEDIATE MEDICAL CARE IF: °· You have dizziness. °· You faint or nearly faint. °· You have shortness of breath. °· You have chest pain or pressure. °· You have sudden nausea or vomiting. °· You have profuse sweating. °· You are concerned about how long your symptoms last. °· You are concerned about the frequency of your SVT episodes. °If you have the above symptoms, call your local emergency services (911 in [place] immediately. Do not drive yourself to the hospital. °MAKE SURE YOU:  °· Understand these instructions. °· Will watch your condition. °· Will get help right away if you are not doing well or get worse. °Document Released: 08/24/2005 Document Revised: 11/16/2011 Document Reviewed: 12/06/2008 °ExitCare® Patient Information ©2015 [employer]. This information is not intended to replace advice given to you by your health care provider. Make sure you discuss any questions you have with your health care provider. ° °

## 2015-05-31 NOTE — ED Provider Notes (Deleted)
I saw and evaluated the patient, reviewed the resident's note and I agree with the findings and plan.   EKG Interpretation   Date/Time:  Friday May 31 2015 15:19:38 EDT Ventricular Rate:  153 PR Interval:  182 QRS Duration: 94 QT Interval:  312 QTC Calculation: 498 R Axis:   -11 Text Interpretation:  PSVT vs. Aflutter Anterior infarct , age  undetermined Abnormal ECG new rhythm for patient Confirmed by Rhunette Croft,  MD, Janey Genta [phone]) on 05/31/2015 6:22:45 PM      Pt comes in with dizziness. Noted to be tachycardic and hypotensive at arrival. Pt felt like she might faint. She has CKD. She has L leg pain. Pt has no hx of PE, DVT and denies any exogenous estrogen use, long distance travels or surgery in the past 6 weeks, active cancer, recent immobilization.  We did a vagal maneuver - and were successfully able to convert her to what appears to be sinus.  Dimer is + - and CT PE is ordered.  BP improved, HR better, pt is feeling better.   EKG Interpretation  Date/Time:  Friday May 31 2015 15:36:42 EDT Ventricular Rate:  83 PR Interval:  216 QRS Duration: 91 QT Interval:  374 QTC Calculation: 439 R Axis:   47 Text Interpretation:  Sinus rhythm Prolonged PR interval Premature ventricular complexes Borderline repolarization abnormality ST elevation, consider inferior injury converted to sinus post vagal maneuvers Confirmed by Rhunette Croft, MD, Janey Genta [phone]) on 05/31/2015 6:28:40 PM      \   Derwood Kaplan, MD 05/31/15 0981

## 2015-05-31 NOTE — ED Notes (Signed)
Pt back from CT. Stable.

## 2015-05-31 NOTE — ED Notes (Signed)
Pt states that she began feel "bad" at 1330 today. Pt reports generalized weakness and chills.

## 2015-05-31 NOTE — ED Provider Notes (Signed)
CSN: [id number]     Arrival date & time 05/31/15  1509 History   First MD Initiated Contact with Patient 05/31/15 1530     Chief Complaint  Patient presents with  . Weakness   Traci Brown is a 59 y.o. female with a past medical history significant for hypertension, chronic kidney disease, and hypercholesterolemia who presents with a 2 hour history of sudden onset of lightheadedness and fatigue. The patient denies any chest pain, shortness of breath, nausea, vomiting, gas patient, diarrhea, or any other symptoms. The patient says that she was of complete normal health this morning prior to feeling bad. The patient does report that she had some right-sided leg pain last week, for which she had a sterile injection her back. The patient denies any change in the severity of her symptoms and just feels like she is going to pass out. PT denies any prior similar symptoms.   In triage, the patient's blood pressure was hypotensive and the patient was tachycardic. The patient was quickly brought to the resuscitation bay.    (Consider location/radiation/quality/duration/timing/severity/associated sxs/prior Treatment) Patient is a 59 y.o. female presenting with dizziness. The history is provided by the patient. No language interpreter was used.  Dizziness Quality:  Lightheadedness Severity:  Severe Onset quality:  Sudden Duration:  2 hours Timing:  Constant Progression:  Unchanged Chronicity:  New Context: not with loss of consciousness   Relieved by:  Nothing Worsened by:  Nothing Ineffective treatments:  Lying down Associated symptoms: no chest pain, no diarrhea, no headaches, no hearing loss, no nausea, no palpitations, no shortness of breath, no syncope, no vision changes, no vomiting and no weakness     Past Medical History  Diagnosis Date  . High cholesterol   . Hypertension 07/08/2012  . Leg cramps 04/15/2011    Pt was having cramps in the thigh   . Left-sided chest wall pain  07/08/2012  . CKD (chronic kidney disease) stage 3, GFR 30-59 ml/min 07/08/2012   Past Surgical History  Procedure Laterality Date  . Cholecystectomy    . Back surgery    . Womb tied (in [place] exact procedure unknown)     Family History  Problem Relation Age of Onset  . Hypertension Mother   . Heart disease Neg Hx    Social History  Substance Use Topics  . Smoking status: Never Smoker   . Smokeless tobacco: None  . Alcohol Use: No   OB History    No data available     Review of Systems  Constitutional: Negative for chills and diaphoresis.  HENT: Negative for congestion, hearing loss and rhinorrhea.   Respiratory: Negative for cough, choking, chest tightness, shortness of breath, wheezing and stridor.   Cardiovascular: Negative for chest pain, palpitations, leg swelling and syncope.  Gastrointestinal: Negative for nausea, vomiting, abdominal pain, diarrhea and constipation.  Genitourinary: Negative for flank pain.  Musculoskeletal: Negative for back pain, neck pain and neck stiffness.  Skin: Negative for rash and wound.  Neurological: Positive for light-headedness. Negative for seizures, weakness, numbness and headaches.  Psychiatric/Behavioral: Negative for confusion and agitation.  All other systems reviewed and are negative.     Allergies  Review of patient's allergies indicates no known allergies.  Home Medications   Prior to Admission medications   Medication Sig Start Date End Date Taking? Authorizing Cleatus Gabriel  diclofenac sodium (VOLTAREN) 1 % GEL Apply 4 g topically 4 (four) times daily. 10/09/14   Ejiroghene Wendall Stade, MD  gemfibrozil (LOPID) 600  MG tablet Take 1 tablet (600 mg total) by mouth 2 (two) times daily before a meal. 10/16/14   Ejiroghene E Emokpae, MD  hydrochlorothiazide (HYDRODIURIL) 25 MG tablet Take 1 tablet (25 mg total) by mouth daily. 07/08/12   Lollie Sails, MD  traMADol (ULTRAM) 50 MG tablet Take 1 tablet (50 mg total) by mouth every 8  (eight) hours as needed. 05/01/14   Ejiroghene E Emokpae, MD   BP 79/50 mmHg  Pulse 154  Temp(Src) 97.3 F (36.3 C) (Oral)  Resp 18  SpO2 96% Physical Exam  Constitutional: She is oriented to person, place, and time. She appears well-developed and well-nourished. No distress.  HENT:  Head: Normocephalic and atraumatic.  Mouth/Throat: No oropharyngeal exudate.  Eyes: Conjunctivae are normal. Pupils are equal, round, and reactive to light.  Neck: Normal range of motion.  Cardiovascular: Regular rhythm and intact distal pulses.  Tachycardia present.   No murmur heard. Pulmonary/Chest: Effort normal and breath sounds normal. No stridor. No respiratory distress. She has no wheezes. She exhibits no tenderness.  Abdominal: Soft. She exhibits no distension. There is no tenderness.  Musculoskeletal: She exhibits no tenderness.  Neurological: She is alert and oriented to person, place, and time. No cranial nerve deficit.  Skin: Skin is warm. She is not diaphoretic. No erythema.  Psychiatric: She has a normal mood and affect.  Nursing note and vitals reviewed.   ED Course  Procedures (including critical care time) Labs Review Labs Reviewed  BASIC METABOLIC PANEL - Abnormal; Notable for the following:    Glucose, Bld 157 (*)    Creatinine, Ser 1.22 (*)    GFR calc non Af Amer 48 (*)    GFR calc Af Amer 56 (*)    All other components within normal limits  CBC - Abnormal; Notable for the following:    WBC 2.7 (*)    RBC 3.59 (*)    Hemoglobin 9.7 (*)    HCT 30.8 (*)    All other components within normal limits  D-DIMER, QUANTITATIVE (NOT AT [facility]) - Abnormal; Notable for the following:    D-Dimer, Quant 0.53 (*)    All other components within normal limits  MAGNESIUM  I-STAT TROPOININ, ED    Imaging Review Ct Angio Chest Pe W/cm &/or Wo Cm  05/31/2015   CLINICAL DATA:  Dizziness, tachycardia, hypotension. Elevated D-dimer.  EXAM: CT ANGIOGRAPHY CHEST WITH CONTRAST  TECHNIQUE:  Multidetector CT imaging of the chest was performed using the standard protocol during bolus administration of intravenous contrast. Multiplanar CT image reconstructions and MIPs were obtained to evaluate the vascular anatomy.  CONTRAST:  80mL OMNIPAQUE IOHEXOL 350 MG/ML SOLN  COMPARISON:  Chest x-ray today.  Chest CT 07/15/2007  FINDINGS: Linear areas of atelectasis or scarring in the lungs bilaterally. No confluent opacities. No effusions.  Mild cardiomegaly. Small pericardial effusion. No mediastinal, hilar, or axillary adenopathy.  No filling defects in the pulmonary arteries to suggest pulmonary emboli. Chest wall soft tissues are unremarkable. Imaging into the upper abdomen shows no acute findings.  No acute bony abnormality or focal bone lesion.  Review of the MIP images confirms the above findings.  IMPRESSION: No evidence of pulmonary embolus.  Mild cardiomegaly, small pericardial effusion.  Areas of atelectasis or scarring in the lungs bilaterally.   Electronically Signed   By: Charlett Nose M.D.   On: 05/31/2015 18:41   Dg Chest Portable 1 View  05/31/2015   CLINICAL DATA:  Weakness, chills, sudden onset today. Hypotension.  Near syncope.  EXAM: PORTABLE CHEST 1 VIEW  COMPARISON:  04/29/2009  FINDINGS: Heart is borderline in size. Lungs are clear. No effusions. No acute bony abnormality.  IMPRESSION: No active disease.   Electronically Signed   By: Charlett Nose M.D.   On: 05/31/2015 16:30   I have personally reviewed and evaluated these images and lab results as part of my medical decision-making.   EKG Interpretation   Date/Time:  Friday May 31 2015 15:58:14 EDT Ventricular Rate:  108 PR Interval:  227 QRS Duration: 83 QT Interval:  478 QTC Calculation: 641 R Axis:   14 Text Interpretation:  Sinus tachycardia Ventricular tachycardia,  unsustained Prolonged PR interval Borderline repolarization abnormality  Prolonged QT interval Baseline wander in lead(s) III aVL aVF Confirmed by   Rhunette Croft, MD, Janey Genta [phone]) on 05/31/2015 10:15:00 PM      MDM   Traci Brown is a 59 y.o. female with a past medical history significant for hypertension, chronic kidney disease, and hypercholesterolemia who presents with a 2 hour history of sudden onset of lightheadedness and fatigue. After arrival to the resuscitation bay, the patient's blood pressure improved to greater than 100 systolic however she remained tachycardic around 150 bpm.  A bedside modified vagal maneuver was attempted prompting the patient to convert into a sinus rhythm. The patient had a repeat EKG performed showing a HR in the 80's. given the patient's tachycardia, the patient will have laboratory and imaging testing to evaluate for etiology of her symptoms. The patient will have a d-dimer given her right-sided leg pain recently.   The patient's diagnostic results are seen above. The patient was found to have a slightly elevated d-dimer prompting a CT scan to look for pulmonary embolism. The CT scan did not show evidence of acute pulmonary embolism. There was evidence of a small pericardial effusion. The patient's chest x-ray showed no acute cardiopulmonary abnormality. The patient's laboratory testing are also seen above the troponin was negative, the BMP showed a creatinine sore to prior, and her CBC did not show leukocytosis. The patient's hemoglobin was similar to prior.  The patient was given a dose of metoprolol after she converted from SVT to a sinus rhythm. The patient was observed for several hours and she did not have any other arrhythmias or return of her fatigue symptoms. The patient was instructed to follow-up with her PCP as well as cardiology for further management. The patient was started on metoprolol   and was given return precautions for any new or worsening symptoms. The patient was given instructions on vagal maneuvers as this converted her during her stay. The patient and her family had no other  questions, concerns, or complaints and the patient was discharged in good condition.  This patient was seen with Dr. Rhunette Croft, emergency medicine attending.  Final diagnoses:  PSVT (paroxysmal supraventricular tachycardia)           Theda Belfast, MD 06/01/15 [id number]  Derwood Kaplan, MD 06/01/15 1521

## 2015-05-31 NOTE — ED Notes (Signed)
EDP notified that pt. is requesting update and plan of care .

## 2015-06-18 ENCOUNTER — Ambulatory Visit (INDEPENDENT_AMBULATORY_CARE_PROVIDER_SITE_OTHER): Payer: 59 | Admitting: Internal Medicine

## 2015-06-18 VITALS — BP 145/72 | HR 67 | Temp 98.1°F | Ht 69.0 in | Wt 209.1 lb

## 2015-06-18 DIAGNOSIS — D72819 Decreased white blood cell count, unspecified: Secondary | ICD-10-CM | POA: Diagnosis not present

## 2015-06-18 DIAGNOSIS — I471 Supraventricular tachycardia, unspecified: Secondary | ICD-10-CM

## 2015-06-18 DIAGNOSIS — R1013 Epigastric pain: Secondary | ICD-10-CM

## 2015-06-18 DIAGNOSIS — I129 Hypertensive chronic kidney disease with stage 1 through stage 4 chronic kidney disease, or unspecified chronic kidney disease: Secondary | ICD-10-CM

## 2015-06-18 DIAGNOSIS — G8929 Other chronic pain: Secondary | ICD-10-CM

## 2015-06-18 DIAGNOSIS — M79604 Pain in right leg: Secondary | ICD-10-CM

## 2015-06-18 DIAGNOSIS — N189 Chronic kidney disease, unspecified: Secondary | ICD-10-CM

## 2015-06-18 DIAGNOSIS — E785 Hyperlipidemia, unspecified: Secondary | ICD-10-CM | POA: Diagnosis not present

## 2015-06-18 DIAGNOSIS — Z0000 Encounter for general adult medical examination without abnormal findings: Secondary | ICD-10-CM | POA: Insufficient documentation

## 2015-06-18 DIAGNOSIS — E7800 Pure hypercholesterolemia, unspecified: Secondary | ICD-10-CM

## 2015-06-18 DIAGNOSIS — M6282 Rhabdomyolysis: Secondary | ICD-10-CM

## 2015-06-18 HISTORY — DX: Supraventricular tachycardia: I47.1

## 2015-06-18 HISTORY — DX: Supraventricular tachycardia, unspecified: I47.10

## 2015-06-18 HISTORY — DX: Epigastric pain: R10.13

## 2015-06-18 MED ORDER — OMEPRAZOLE 20 MG PO CPDR: 20.0000 mg | DELAYED_RELEASE_CAPSULE | Freq: Every day | ORAL | Status: DC

## 2015-06-18 MED ORDER — METOPROLOL TARTRATE 25 MG PO TABS: 25.0000 mg | ORAL_TABLET | Freq: Two times a day (BID) | ORAL | Status: DC

## 2015-06-18 NOTE — Assessment & Plan Note (Signed)
Leg pain- chronic. She went to see an orthopedist- self referral on recommendation by a friend, and then had MRI done,- 01/24/2015. Notes not in epic but imaging is- Showed severe facet a Mild multilevel disc degeneration and moderate facet arthrosis, most notable at L4-5 where there is mild spinal stenosis. 2. Severe facet arthrosis at L5-S1.  She said she was given some injections which helped for about 3 weeks, but them pain returned.  She denies back pain, but says pain originates in her groin, and goes down her legs, She describes pain as deep in her leg, from hip to leg/calf area. She says pain is worse with weight bearing, and walking, in the morning she has stiffness in hip that takes 5-6 mins to get better to the point she can use it. She has been applying vicks balm/ointment to it, also intermittently/occasionally using Advil.  She was prescribed tramadol at a point but cannot remember the medication or if this worked. She also tried topical meds- voltaren and physical therapy without much effect.  Plan- Right Hip xray,  - she has CKD- GFR- 56, but would be cautious about use of NSAIDs. - Might need to consider a trial Tramadol

## 2015-06-18 NOTE — Assessment & Plan Note (Signed)
Complaints of burning epigastric pain, worse with meals, she feels it gets up to her throat.   Plan- Trial of Omeprazole  daily.

## 2015-06-18 NOTE — Assessment & Plan Note (Addendum)
Pt in ED- 9/23 with symptomatic PSVT- lightheadedness, weakness, was also hypotensive. She converted with vagal manoveres. Was also given metop Iv, and dischargedhome to cont  daily of metop. Her lab work- Bmet, CBC, Mag were unremarkable. She denied caffene use- coffee, high energy drinks or lots of soda. She denies use of illicit drugs, or alcohol use. EKG suggestive of a re-entry tachycardia.  Plan- TSH check - Refer to cardiology - Change metop to  BID  Addendum- 06/30/2015 -TSH slightly elevated. Pt not hyperthyroid. Unknown cause of PSVT.

## 2015-06-18 NOTE — Assessment & Plan Note (Addendum)
BP Readings from Last 3 Encounters:  06/18/15 145/72  05/31/15 134/73  10/09/14 136/71    Lab Results  Component Value Date   NA 138 05/31/2015   K 3.8 05/31/2015   CREATININE 1.22* 05/31/2015    Assessment: Blood pressure control:  Slightly elevated Comments: She was previously on HCTZ, it appears patient stopped taking this med when she ran out and did not ask for refills, she was in the ED 2 weeks ago- 05/31/2015 with paroxysmal SVT, she was then started on metoprolol  daily, which she has been complaint with.  Plan: Medications:  Continue current meds today but BID not daily, if BP is still slightly elevated next visit consider increasing metop to  BID Other plans:

## 2015-06-18 NOTE — Progress Notes (Signed)
Patient ID: Traci Brown, female   DOB: 1956/06/13, 59 y.o.   MRN: [id number]   Subjective:   Patient ID: Traci Brown female   DOB: [birth year] 59 y.o.   MRN: [id number]  HPI: Traci Brown is a 59 y.o. with PMH listed below, presented today for ED follow and follow up of her chronic medical conditions- HTN and right leg pain.  Past Medical History  Diagnosis Date  . High cholesterol   . Hypertension 07/08/2012  . Leg cramps 04/15/2011    Pt was having cramps in the thigh   . Left-sided chest wall pain 07/08/2012  . CKD (chronic kidney disease) stage 3, GFR 30-59 ml/min 07/08/2012   Current Outpatient Prescriptions  Medication Sig Dispense Refill  . Green Tea, Camillia sinensis, (GREEN TEA PO) Take 1 tablet by mouth daily.    Marland Kitchen ibuprofen (ADVIL,MOTRIN) 200 MG tablet Take 200 mg by mouth every 6 (six) hours as needed.    . metoprolol (LOPRESSOR) 25 MG tablet Take 1 tablet (25 mg total) by mouth daily. 30 tablet 0   No current facility-administered medications for this visit.   Family History  Problem Relation Age of Onset  . Hypertension Mother   . Heart disease Neg Hx    Social History   Social History  . Marital Status: Single    Spouse Name: N/A  . Number of Children: N/A  . Years of Education: N/A   Occupational History  . 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 Smoking status: Never Smoker   . Smokeless tobacco: Not on file  . Alcohol Use: No  . Drug Use: No  . Sexual Activity: No   Other Topics Concern  . Not on file   Social History Narrative   Review of Systems: CONSTITUTIONAL- No Fever, weightloss, or change in appetite. SKIN- No Rash, colour changes or itching. HEAD- No Headache or dizziness. Mouth/throat- No Sorethroat, dentures, or bleeding gums. RESPIRATORY- No Cough or SOB. CARDIAC- No Palpitations,  or chest pain. GI- No vomiting, diarrhoea,  abd pain. URINARY- No Frequency,  or dysuria. NEUROLOGIC- No  Numbness, syncope, seizures or burning. [facility]- Denies depression or anxiety.  Objective:  Physical Exam: Filed Vitals:   06/18/15 1450  BP: 145/72  Pulse: 67  Temp: 98.1 F (36.7 C)  TempSrc: Oral  Height:  (1.753 m)  Weight: 209 lb 1.6 oz (94.847 kg)  SpO2: 100%   GENERAL- alert, co-operative, appears as stated age, not in any distress. HEENT- Atraumatic, normocephalic, PERRL, EOMI, oral mucosa appears moist, neck supple. CARDIAC- RRR, no murmurs, rubs or gallops. RESP- Moving equal volumes of air, and clear to auscultation bilaterally, no wheezes or crackles. ABDOMEN- Soft, nontender, bowel sounds present. BACK- Normal curvature of the spine, no CVA tenderness. NEURO- No obvious Cr N abnormality, strenght upper and lower extremities- 5/5, Gait- Normal. EXTREMITIES- pulse 2+, symmetric, no pedal edema. Right hip- pain with internal rotation and external rotation, on flexion of hip, no bruising on skin on inspection. Appears to have normal range of motion, and not limited by pain. SKIN- Warm, dry, No rash or lesion. PSYCH- Normal mood and affect, appropriate thought content and speech.  Assessment & Plan:  The patient's case and plan of care was discussed with attending physician, Dr. Heide Spark.  Please see problem based charting for assessment and plan.

## 2015-06-18 NOTE — Patient Instructions (Signed)
We will be getting an xray on you first then we will discuss results.  Also I think you are having reflux symptoms so we will restart the omeprazole. Take one tablet- once a day.

## 2015-06-18 NOTE — Assessment & Plan Note (Addendum)
Chronically low. LAst CBC- WBC-  2.7 - 05/31/2015.   Plan- HIV - Unfortunately forgot to add a CBC with Diff. To be ordered on follow up visit.

## 2015-06-18 NOTE — Assessment & Plan Note (Addendum)
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 She has been working on her diet. She was started on gemfibrozil for her elevated triglycerides- ~586s. She cannot remember if she took this medication.  Plan- Lipid panel today - Consider Zetia for hyperchol - CK level.  Addendum- 06/20/2015- Pt with elevated triglyceride level- 438. Did not tolerate statin in the past, due to myalgias, with elevated CK- 1470s, dropped to 586s off statin, but is now elevated again to 1228. She denies myalgias at this time and is complaining of right hip pain only. 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 Will start her on Fibrates, as per UTD, there is cardiovascular benefit. It will also help lower her Triglycerides. Pt has been prescribed gemfibrozil in the past but she cannot remember the medication or how she felt or recurrence of her body pain. Will give trial of genfibrozil for 3 month at  BID. If myalgias recurs, consider switching to Zetia.

## 2015-06-18 NOTE — Assessment & Plan Note (Signed)
Talk to patient about Pap smear and colonoscopy next visit.

## 2015-06-19 LAB — LIPID PANEL
CHOL/HDL RATIO: 13.2 ratio — AB (ref 0.0–4.4)
Cholesterol, Total: 422 mg/dL — ABNORMAL HIGH (ref 100–199)
HDL: 32 mg/dL — AB (ref >39)
Triglycerides: 438 mg/dL — ABNORMAL HIGH (ref 0–149)

## 2015-06-19 LAB — HIV ANTIBODY (ROUTINE TESTING W REFLEX): HIV Screen 4th Generation wRfx: NONREACTIVE

## 2015-06-19 LAB — TSH: TSH: 5.68 u[IU]/mL — AB (ref 0.450–4.500)

## 2015-06-19 LAB — CK: CK TOTAL: 1228 U/L — AB (ref 24–173)

## 2015-06-20 ENCOUNTER — Ambulatory Visit (HOSPITAL_COMMUNITY)
Admission: RE | Admit: 2015-06-20 | Discharge: 2015-06-20 | Disposition: A | Discharge status: Home / Self Care | Payer: 59 | Source: Ambulatory Visit | Attending: Internal Medicine | Admitting: Internal Medicine

## 2015-06-20 ENCOUNTER — Other Ambulatory Visit: Payer: Self-pay | Admitting: Internal Medicine

## 2015-06-20 DIAGNOSIS — M25551 Pain in right hip: Secondary | ICD-10-CM | POA: Diagnosis not present

## 2015-06-20 MED ORDER — GEMFIBROZIL 600 MG PO TABS: 600.0000 mg | ORAL_TABLET | Freq: Two times a day (BID) | ORAL | Status: DC

## 2015-06-20 NOTE — Addendum Note (Signed)
Addended by: Onnie BoerEMOKPAE, Siraj Dermody E on: 06/20/2015 01:46 PM   Modules accepted: Orders

## 2015-06-21 NOTE — Progress Notes (Signed)
Quick Note:  Patients hip pain likely due to degenerative diease. She has CKD so chronic NSAIDS would be an issue and tylenol does not seem to be effective. She might need a trial of Tramadol. 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   Ejiro. ______

## 2015-06-22 NOTE — Progress Notes (Signed)
Internal Medicine Clinic Attending  Case discussed with Dr. Emokpae soon after the resident saw the patient.  We reviewed the resident's history and exam and pertinent patient test results.  I agree with the assessment, diagnosis, and plan of care documented in the resident's note. 

## 2015-06-25 NOTE — Addendum Note (Signed)
Addended by: Onnie BoerEMOKPAE, Calub Tarnow E on: 06/25/2015 01:05 PM   Modules accepted: Level of Service

## 2015-06-27 ENCOUNTER — Encounter: Payer: Self-pay | Admitting: Physician Assistant

## 2015-06-27 ENCOUNTER — Ambulatory Visit (INDEPENDENT_AMBULATORY_CARE_PROVIDER_SITE_OTHER): Payer: 59 | Admitting: Physician Assistant

## 2015-06-27 VITALS — BP 120/80 | HR 74 | Ht 69.0 in | Wt 207.8 lb

## 2015-06-27 DIAGNOSIS — I471 Supraventricular tachycardia: Secondary | ICD-10-CM

## 2015-06-27 DIAGNOSIS — E785 Hyperlipidemia, unspecified: Secondary | ICD-10-CM | POA: Diagnosis not present

## 2015-06-27 DIAGNOSIS — N183 Chronic kidney disease, stage 3 unspecified: Secondary | ICD-10-CM

## 2015-06-27 DIAGNOSIS — I10 Essential (primary) hypertension: Secondary | ICD-10-CM | POA: Diagnosis not present

## 2015-06-27 NOTE — Patient Instructions (Addendum)
Medication Instructions:  Your physician recommends that you continue on your current medications as directed. Please refer to the Current Medication list given to you today.  Lab work: NONE  Testing/Procedures: Your physician has requested that you have en exercise stress myoview. For further information please visit [email or web address]. Please follow instruction sheet, as given.   Your physician has requested that you have an echocardiogram. Echocardiography is a painless test that uses sound waves to create images of your heart. It provides your doctor with information about the size and shape of your heart and how well your heart's chambers and valves are working. This procedure takes approximately one hour. There are no restrictions for this procedure.  Follow-Up: Your physician recommends that you schedule a follow-up appointment in 1 month with Dr. Elberta Fortisamnitz (EP)  Your physician recommends that you schedule a follow-up appointment with Audrie LiaSally Earl Pharmacist for [facility]     Exercise Stress Electrocardiogram An exercise stress electrocardiogram is a test that is done to evaluate the blood supply to your heart. This test may also be called exercise stress electrocardiography. The test is done while you are walking on a treadmill. The goal of this test is to raise your heart rate. This test is done to find areas of poor blood flow to the heart by determining the extent of coronary artery disease (CAD).   CAD is defined as narrowing in one or more heart (coronary) arteries of more than 70%. If you have an abnormal test result, this may mean that you are not getting adequate blood flow to your heart during exercise. Additional testing may be needed to understand why your test was abnormal. LET [facility] KNOW ABOUT:   Any allergies you have.  All medicines you are taking, including vitamins, herbs, eye drops, creams, and over-the-counter medicines.  Previous problems  you or members of your family have had with the use of anesthetics.  Any blood disorders you have.  Previous surgeries you have had.  Medical conditions you have.  Possibility of pregnancy, if this applies. RISKS AND COMPLICATIONS Generally, this is a safe procedure. However, as with any procedure, complications can occur. Possible complications can include:  Pain or pressure in the following areas:  Chest.  Jaw or neck.  Between your shoulder blades.  Radiating down your left arm.  Dizziness or light-headedness.  Shortness of breath.  Increased or irregular heartbeats.  Nausea or vomiting.  Heart attack (rare). BEFORE THE PROCEDURE  Avoid all forms of caffeine 24 hours before your test or as directed by your health care provider. This includes coffee, tea (even decaffeinated tea), caffeinated sodas, chocolate, cocoa, and certain pain medicines.  Follow your health care provider's instructions regarding eating and drinking before the test.  Take your medicines as directed at regular times with water unless instructed otherwise. Exceptions may include:  If you have diabetes, ask how you are to take your insulin or pills. It is common to adjust insulin dosing the morning of the test.  If you are taking beta-blocker medicines, it is important to talk to your health care provider about these medicines well before the date of your test. Taking beta-blocker medicines may interfere with the test. In some cases, these medicines need to be changed or stopped 24 hours or more before the test.  If you wear a nitroglycerin patch, it may need to be removed prior to the test. Ask your health care provider if the patch should be removed before the test.  If you use an inhaler for any breathing condition, bring it with you to the test.  If you are an outpatient, bring a snack so you can eat right after the stress phase of the test.  Do not smoke for 4 hours prior to the test or as  directed by your health care provider.  Do not apply lotions, powders, creams, or oils on your chest prior to the test.  Wear loose-fitting clothes and comfortable shoes for the test. This test involves walking on a treadmill. PROCEDURE  Multiple patches (electrodes) will be put on your chest. If needed, small areas of your chest may have to be shaved to get better contact with the electrodes. Once the electrodes are attached to your body, multiple wires will be attached to the electrodes and your heart rate will be monitored.  Your heart will be monitored both at rest and while exercising.  You will walk on a treadmill. The treadmill will be started at a slow pace. The treadmill speed and incline will gradually be increased to raise your heart rate. AFTER THE PROCEDURE  Your heart rate and blood pressure will be monitored after the test.  You may return to your normal schedule including diet, activities, and medicines, unless your health care provider tells you otherwise.   This information is not intended to replace advice given to you by your health care provider. Make sure you discuss any questions you have with your health care provider.   Document Released: 08/21/2000 Document Revised: 08/29/2013 Document Reviewed: 05/01/2013 Elsevier Interactive Patient Education 2016 [employer]. Echocardiogram An echocardiogram, or echocardiography, uses sound waves (ultrasound) to produce an image of your heart. The echocardiogram is simple, painless, obtained within a short period of time, and offers valuable information to your health care provider. The images from an echocardiogram can provide information such as:  Evidence of coronary artery disease (CAD).  Heart size.  Heart muscle function.  Heart valve function.  Aneurysm detection.  Evidence of a past heart attack.  Fluid buildup around the heart.  Heart muscle thickening.  Assess heart valve function. LET [facility]  CARE PROVIDER KNOW ABOUT:  Any allergies you have.  All medicines you are taking, including vitamins, herbs, eye drops, creams, and over-the-counter medicines.  Previous problems you or members of your family have had with the use of anesthetics.  Any blood disorders you have.  Previous surgeries you have had.  Medical conditions you have.  Possibility of pregnancy, if this applies. BEFORE THE PROCEDURE  No special preparation is needed. Eat and drink normally.  PROCEDURE   In order to produce an image of your heart, gel will be applied to your chest and a wand-like tool (transducer) will be moved over your chest. The gel will help transmit the sound waves from the transducer. The sound waves will harmlessly bounce off your heart to allow the heart images to be captured in real-time motion. These images will then be recorded.  You may need an IV to receive a medicine that improves the quality of the pictures. AFTER THE PROCEDURE You may return to your normal schedule including diet, activities, and medicines, unless your health care provider tells you otherwise.   This information is not intended to replace advice given to you by your health care provider. Make sure you discuss any questions you have with your health care provider.   Document Released: 08/21/2000 Document Revised: 09/14/2014 Document Reviewed: 05/01/2013 Elsevier Interactive Patient Education [employer]

## 2015-06-27 NOTE — Progress Notes (Signed)
Patient ID: Traci Brown, female   DOB: Apr 21, 1956, 59 y.o.   MRN: [id number]    Date:  06/27/2015   ID:  Traci Brown, DOB Apr 21, 1956, MRN [id number]  PCP:  Kennis CarinaEmokpae, [place], MD  Primary Cardiologist:  New- Will assign to Dr. Elberta Fortisamnitz  Chief complaint: Follow-up for SVT   History of Present Illness: Traci Brown is a 59 y.o. female with history of hypertension, severe hyperlipidemia, chronic kidney disease stage III.  Recent lipid panel: Total cholesterol 422 triglyceride level was 438. LDL back in February 2016 was 187. She has not tolerated statins in the past with elevated CK levels and myalgias. This improved off of statin. She was recently started on gemfibrozil by her PCP.  She was seen in the hospital on September 23 with SVT.  It was converted with vagal maneuvers she was started on metoprolol.  She presents for follow-up.  She reports no further episodes but does feel tired and occasionally feels short of breath when she is walking.  She has not been able to sleep well.  When she was having the SVT, she was dizzy and had chest tightness. Both her parents died in the war in [place] in the [place]s.  As far she knows her sister is no heart conditions. He does exercise at home every 2-3 days on her treadmill does not have any chest pain.  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  The patient currently denies nausea, vomiting, fever, orthopnea, PND, cough, congestion, abdominal pain, hematochezia, melena, lower extremity edema, claudication.  Lipid Panel     Component Value Date/Time   CHOL 422* 06/18/2015 1601   CHOL 344* 10/09/2014 1630   TRIG 438* 06/18/2015 1601   HDL 32* 06/18/2015 1601   HDL 29* 10/09/2014 1630   CHOLHDL 13.2* 06/18/2015 1601   CHOLHDL 11.9 10/09/2014 1630   VLDL NOT CALC 10/09/2014 1630   LDLCALC Comment 06/18/2015 1601   LDLCALC NOT CALC 10/09/2014 1630   LDLDIRECT 187* 10/11/2014 0240       Wt Readings from Last 3 Encounters:  06/27/15 207 lb 12.8 oz (94.257 kg)  06/18/15 209 lb 1.6 oz (94.847 kg)  10/09/14 200 lb 4.8 oz (90.855 kg)     Past Medical History  Diagnosis Date  . High cholesterol   . Hypertension 07/08/2012  . Leg cramps 04/15/2011    Pt was having cramps in the thigh   . Left-sided chest wall pain 07/08/2012  . CKD (chronic kidney disease) stage 3, GFR 30-59 ml/min 07/08/2012    Current Outpatient Prescriptions  Medication Sig Dispense Refill  . gemfibrozil (LOPID) 600 MG tablet Take 1 tablet (600 mg total) by mouth 2 (two) times daily before a meal. 60 tablet 2  . Green Tea, Camillia sinensis, (GREEN TEA PO) Take 1 tablet by mouth daily.    Marland Kitchen. ibuprofen (ADVIL,MOTRIN) 200 MG tablet Take 200 mg by mouth every 6 (six) hours as needed (pain).     . metoprolol tartrate (LOPRESSOR) 25 MG tablet Take 1 tablet (25 mg total) by mouth 2 (two) times daily. 60 tablet 1  . omeprazole (PRILOSEC) 20 MG capsule Take 1 capsule (20 mg total) by mouth daily. 30 capsule 2   No current facility-administered medications for this visit.    Allergies:   No Known Allergies  Social History:  The patient  reports that she has never smoked. She does not have any smokeless tobacco  history on file. She reports that she does not drink alcohol or use illicit drugs.   Family history:   Family History  Problem Relation Age of Onset  . Hypertension Mother   . Heart disease Neg Hx     ROS:  Please see the history of present illness.  All other systems reviewed and negative.   PHYSICAL EXAM: VS:  Pulse 74  Ht  (1.753 m)  Wt 207 lb 12.8 oz (94.257 kg)  BMI 30.67 kg/m2  SpO2 98% Obese, well developed, in no acute distress HEENT: Pupils are equal round react to light accommodation extraocular movements are intact.  Neck: no JVDNo cervical lymphadenopathy. Cardiac: Regular rate and rhythm without murmurs rubs or gallops. Lungs:  clear to auscultation bilaterally, no  wheezing, rhonchi or rales Abd: soft, nontender, positive bowel sounds all quadrants, no hepatosplenomegaly Ext: no lower extremity edema.  2+ radial and dorsalis pedis pulses. Skin: warm and dry Neuro:  Grossly normal  EKG:   September 23 while NSVT slight lateral and inferior ST depression.  ASSESSMENT AND PLAN:  Problem List Items Addressed This Visit    Paroxysmal supraventricular tachycardia (HCC)   Hypertension   HLD (hyperlipidemia)   CKD (chronic kidney disease) stage 3, GFR 30-59 ml/min    Other Visit Diagnoses    Paroxysmal SVT (supraventricular tachycardia) (HCC)    -  Primary    Relevant Orders    Echocardiogram    Myocardial Perfusion Imaging       Paroxysmal superventricular tachycardia She does not have any further episodes since being started on metoprolol. We'll continue the current dose. It may be contributing to her fatigue. When her heart rate was 153 bpm she was having some chest tightness and EKG showed mild ST depression inferior and laterally. We will check an echocardiogram and treadmill Myoview.    Hyperlipidemia Severely elevated triglycerides and cholesterol. She is intolerant to statin; with myalgias and elevated CK. She is now on gemfibrozil. We will refer her to our lipid clinic she may be a candidate for PCSK9 inhibitor.  Essential hypertension: No changes to current medications.  Follow-up with Dr. Elberta Fortis

## 2015-06-28 ENCOUNTER — Telehealth: Payer: Self-pay

## 2015-06-28 DIAGNOSIS — I471 Supraventricular tachycardia: Secondary | ICD-10-CM

## 2015-06-28 NOTE — Addendum Note (Signed)
Addended by: Laurice RecordPATE, Shakiah Wester L on: 06/28/2015 03:39 PM   Modules accepted: Orders

## 2015-06-28 NOTE — Addendum Note (Signed)
Addended by: Cindi CarbonPATE, Antia Rahal L on: 06/28/2015 01:34 PM   Modules accepted: Orders

## 2015-06-28 NOTE — Telephone Encounter (Signed)
Patient wants her test done at [facility] [place]. Reordered to that location.

## 2015-07-01 ENCOUNTER — Ambulatory Visit: Payer: 59 | Admitting: Pharmacist

## 2015-07-11 ENCOUNTER — Telehealth: Payer: Self-pay | Admitting: Physician Assistant

## 2015-07-11 ENCOUNTER — Encounter (HOSPITAL_COMMUNITY)
Admission: RE | Admit: 2015-07-11 | Discharge: 2015-07-11 | Disposition: A | Discharge status: Home / Self Care | Payer: 59 | Source: Ambulatory Visit | Attending: Physician Assistant | Admitting: Physician Assistant

## 2015-07-11 ENCOUNTER — Ambulatory Visit (HOSPITAL_BASED_OUTPATIENT_CLINIC_OR_DEPARTMENT_OTHER)
Admission: RE | Admit: 2015-07-11 | Discharge: 2015-07-11 | Disposition: A | Discharge status: Home / Self Care | Payer: 59 | Source: Ambulatory Visit

## 2015-07-11 ENCOUNTER — Ambulatory Visit (HOSPITAL_COMMUNITY)
Admission: RE | Admit: 2015-07-11 | Discharge: 2015-07-11 | Disposition: A | Discharge status: Home / Self Care | Payer: 59 | Source: Ambulatory Visit | Attending: Physician Assistant | Admitting: Physician Assistant

## 2015-07-11 ENCOUNTER — Other Ambulatory Visit (HOSPITAL_COMMUNITY): Payer: Self-pay | Admitting: Physician Assistant

## 2015-07-11 ENCOUNTER — Other Ambulatory Visit: Payer: Self-pay | Admitting: Physician Assistant

## 2015-07-11 DIAGNOSIS — N183 Chronic kidney disease, stage 3 (moderate): Secondary | ICD-10-CM | POA: Insufficient documentation

## 2015-07-11 DIAGNOSIS — E785 Hyperlipidemia, unspecified: Secondary | ICD-10-CM | POA: Insufficient documentation

## 2015-07-11 DIAGNOSIS — I517 Cardiomegaly: Secondary | ICD-10-CM | POA: Insufficient documentation

## 2015-07-11 DIAGNOSIS — I471 Supraventricular tachycardia: Secondary | ICD-10-CM

## 2015-07-11 DIAGNOSIS — I313 Pericardial effusion (noninflammatory): Secondary | ICD-10-CM | POA: Insufficient documentation

## 2015-07-11 DIAGNOSIS — Q000 Anencephaly: Secondary | ICD-10-CM

## 2015-07-11 DIAGNOSIS — R079 Chest pain, unspecified: Secondary | ICD-10-CM

## 2015-07-11 DIAGNOSIS — M79606 Pain in leg, unspecified: Secondary | ICD-10-CM | POA: Insufficient documentation

## 2015-07-11 LAB — NM MYOCAR MULTI W/SPECT W/WALL MOTION / EF
CSEPEW: 7 METS
CSEPHR: 88 %
CSEPPHR: 144 {beats}/min
Exercise duration (min): 5 min
Exercise duration (sec): 45 s
MPHR: 163 {beats}/min
Rest HR: 83 {beats}/min

## 2015-07-11 MED ORDER — TECHNETIUM TC 99M SESTAMIBI - CARDIOLITE
30.0000 | Freq: Once | INTRAVENOUS | Status: AC | PRN
  Administered 2015-07-11: 30 via INTRAVENOUS

## 2015-07-11 MED ORDER — TECHNETIUM TC 99M SESTAMIBI GENERIC - CARDIOLITE
10.0000 | Freq: Once | INTRAVENOUS | Status: AC | PRN
  Administered 2015-07-11: 10 via INTRAVENOUS

## 2015-07-11 MED ORDER — REGADENOSON 0.4 MG/5ML IV SOLN
INTRAVENOUS | Status: AC
  Filled 2015-07-11: qty 5

## 2015-07-11 NOTE — Progress Notes (Signed)
  Echocardiogram 2D Echocardiogram has been performed.  Arvil ChacoFoster, Chrystine Frogge 07/11/2015, 9:05 AM

## 2015-07-11 NOTE — Telephone Encounter (Signed)
Echocardiogram and stress test results discussed with the patient .  Payton Moder, PAC

## 2015-07-11 NOTE — Progress Notes (Signed)
GXT Myoview performed. Target HR reached in Stage 2. SOB, no chest pain.  Theodore DemarkBarrett, Rhonda, Cordelia Poche-C 07/11/2015 11:38 AM Beeper [phone]

## 2015-07-30 ENCOUNTER — Encounter: Payer: 59 | Admitting: Internal Medicine

## 2015-07-31 ENCOUNTER — Ambulatory Visit: Payer: 59 | Admitting: Cardiology

## 2015-08-13 ENCOUNTER — Encounter: Payer: Self-pay | Admitting: Internal Medicine

## 2015-08-13 ENCOUNTER — Encounter: Payer: 59 | Admitting: Internal Medicine

## 2015-08-20 ENCOUNTER — Encounter: Payer: 59 | Admitting: Internal Medicine

## 2015-08-27 ENCOUNTER — Encounter: Payer: Self-pay | Admitting: Internal Medicine

## 2015-08-27 ENCOUNTER — Ambulatory Visit (INDEPENDENT_AMBULATORY_CARE_PROVIDER_SITE_OTHER): Payer: 59 | Admitting: Internal Medicine

## 2015-08-27 VITALS — BP 135/72 | HR 84 | Temp 98.2°F | Ht 69.0 in | Wt 204.9 lb

## 2015-08-27 DIAGNOSIS — M545 Low back pain, unspecified: Secondary | ICD-10-CM

## 2015-08-27 DIAGNOSIS — E7800 Pure hypercholesterolemia, unspecified: Secondary | ICD-10-CM

## 2015-08-27 DIAGNOSIS — I10 Essential (primary) hypertension: Secondary | ICD-10-CM

## 2015-08-27 DIAGNOSIS — R1013 Epigastric pain: Secondary | ICD-10-CM

## 2015-08-27 MED ORDER — OMEPRAZOLE 20 MG PO CPDR: 20.0000 mg | DELAYED_RELEASE_CAPSULE | Freq: Every day | ORAL | Status: DC

## 2015-08-27 MED ORDER — CYCLOBENZAPRINE HCL 10 MG PO TABS: 10.0000 mg | ORAL_TABLET | Freq: Three times a day (TID) | ORAL | Status: DC | PRN

## 2015-08-27 MED ORDER — METOPROLOL TARTRATE 25 MG PO TABS: 25.0000 mg | ORAL_TABLET | Freq: Two times a day (BID) | ORAL | Status: DC

## 2015-08-27 MED ORDER — GEMFIBROZIL 600 MG PO TABS: 600.0000 mg | ORAL_TABLET | Freq: Two times a day (BID) | ORAL | Status: DC

## 2015-08-27 NOTE — Progress Notes (Signed)
Patient ID: Lincoln BrighamChristiana R Burleson, female   DOB: [birth year], 59 y.o.   MRN: [id number]   Subjective:   Patient ID: Lincoln BrighamChristiana R Koskinen female   DOB: [birth year] 59 y.o.   MRN: [id number]  HPI: Ms.Shirah R Nathaneil Canarygharba is a 59 y.o. with PMH listed below. Presented today for follow up of her Chronic medical conditions- HTN, hyperlipidemia. Please see assessment and plan for review of her problems.  Past Medical History  Diagnosis Date  . High cholesterol   . Hypertension 07/08/2012  . Leg cramps 04/15/2011    Pt was having cramps in the thigh   . Left-sided chest wall pain 07/08/2012  . CKD (chronic kidney disease) stage 3, GFR 30-59 ml/min 07/08/2012   Current Outpatient Prescriptions  Medication Sig Dispense Refill  . gemfibrozil (LOPID) 600 MG tablet Take 1 tablet (600 mg total) by mouth 2 (two) times daily before a meal. 60 tablet 2  . Green Tea, Camillia sinensis, (GREEN TEA PO) Take 1 tablet by mouth daily.    Marland Kitchen. ibuprofen (ADVIL,MOTRIN) 200 MG tablet Take 200 mg by mouth every 6 (six) hours as needed (pain).     . metoprolol tartrate (LOPRESSOR) 25 MG tablet Take 1 tablet (25 mg total) by mouth 2 (two) times daily. 60 tablet 1  . omeprazole (PRILOSEC) 20 MG capsule Take 1 capsule (20 mg total) by mouth daily. 30 capsule 2   No current facility-administered medications for this visit.   Family History  Problem Relation Age of Onset  . Hypertension Mother   . Heart disease Neg Hx    Social History   Social History  . Marital Status: Single    Spouse Name: N/A  . Number of Children: N/A  . Years of Education: N/A   Occupational History  . 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 Smoking status: Never Smoker   . Smokeless tobacco: None  . Alcohol Use: No  . Drug Use: No  . Sexual Activity: No   Other Topics Concern  . None   Social History Narrative   Review of Systems: CONSTITUTIONAL- No Fever, weight stable SKIN- No Rash, colour changes or  itching. HEAD- No Headache or dizziness. Mouth/throat- No Sorethroat, dentures, or bleeding gums. RESPIRATORY- No Cough or SOB. CARDIAC- No Palpitations or chest pain. GI- No  diarrhoea, abd pain. URINARY- No Frequency, urgency, straining or dysuria. NEUROLOGIC- No Numbness, syncope, seizures or burning. [facility]- Denies depression or anxiety.  Objective:  Physical Exam: Filed Vitals:   08/27/15 1542  BP: 135/72  Pulse: 84  Temp: 98.2 F (36.8 C)  TempSrc: Oral  Height: 5\' 9"  (1.753 m)  Weight: 204 lb 14.4 oz (92.942 kg)  SpO2: 100%   GENERAL- alert, co-operative, appears as stated age, not in any distress. HEENT- Atraumatic, normocephalic, PERRL CARDIAC- RRR, no murmurs, rubs or gallops. RESP- Moving equal volumes of air, and clear to auscultation bilaterally, no wheezes or crackles. ABDOMEN- Soft, nontender,  BACK- Normal curvature of the spine, mild tenderness along the paravertebra NEURO- No obvious Cr N abnormality,  Gait- Normal. EXTREMITIES- Warm and well perfused, no pedal edema. SKIN- Warm, dry, No rash or lesion. PSYCH- Normal mood and affect, appropriate thought content and speech.  Assessment & Plan:   The patient's case and plan of care was discussed with attending physician, Dr. Josem KaufmannKlima  Please see problem based charting for assessment and plan.

## 2015-08-27 NOTE — Patient Instructions (Signed)
We will like to see you in 3-4 months, please can and schedule your PAP smear on or before that day.  Also I will be prescribing Flexeril, which is a muscle relaxant, take one tablet three times a day as needed for Back pain.   Also remember to follow up with your heart doctor.  Also for your back and hip pain, do not take more than 1 to 2 tablets of Over the counter Ibuprofen, and take it about 1 to 2 times a week, this is because your kidneys will be affected by high doses and if you take it too frequently.

## 2015-08-28 NOTE — Assessment & Plan Note (Signed)
PAP smear next visit.

## 2015-08-28 NOTE — Assessment & Plan Note (Signed)
BP Readings from Last 3 Encounters:  08/27/15 135/72  07/11/15 173/110  06/27/15 120/80    Lab Results  Component Value Date   NA 138 05/31/2015   K 3.8 05/31/2015   CREATININE 1.22* 05/31/2015    Assessment: Blood pressure control:  Controlled Progress toward BP goal:    Comments: Now on metop 25mg  BID and compliant, no dizziness or chest pains. No palpitations.  Plan: Medications:  continue current medications

## 2015-08-28 NOTE — Progress Notes (Signed)
Case discussed with Dr. Emokpae soon after the resident saw the patient. We reviewed the resident's history and exam and pertinent patient test results. I agree with the assessment, diagnosis, and plan of care documented in the resident's note. 

## 2015-08-28 NOTE — Assessment & Plan Note (Addendum)
Ck- 0979- oct/2016. At this time, no etiology identified. She feels so much better after stopping the Statin, but her CK initialy went down to 500s but then went back up to 1228 despite not taking any statin. TSH mildly elevated at 5.6. She denies weakness or muscle pain today. She has a constellation of lab abnormalities that at this time have not been explained, but never the less pt feels fine- CKD- Stage 3, Anemia of chronic dx, Rhabdomyolysis, a connective tissue dx will explain this but at this point appears undifferentiated. Dermatomyositis is a consideration. Looking back at her chart, in 2012 she complained of cramps in her thighs, which did not respond to NSAIDS. She complains of low back pain present at the end of the day, relived with NSAiDs, she is a transporter in the hospital, walking and lifting everyday.  Plan- Recheck CK levels in 6-12 months, and monitor - Low threshold to get ESR and CRP, also ANA and RF, especially if pt complains of pain.

## 2015-09-30 ENCOUNTER — Other Ambulatory Visit (HOSPITAL_COMMUNITY)
Admission: RE | Admit: 2015-09-30 | Discharge: 2015-09-30 | Disposition: A | Discharge status: Home / Self Care | Payer: 59 | Source: Ambulatory Visit | Attending: Family Medicine | Admitting: Family Medicine

## 2015-09-30 ENCOUNTER — Other Ambulatory Visit: Payer: Self-pay | Admitting: Family Medicine

## 2015-09-30 DIAGNOSIS — E785 Hyperlipidemia, unspecified: Secondary | ICD-10-CM | POA: Diagnosis not present

## 2015-09-30 DIAGNOSIS — Z0000 Encounter for general adult medical examination without abnormal findings: Secondary | ICD-10-CM | POA: Diagnosis not present

## 2015-09-30 DIAGNOSIS — I471 Supraventricular tachycardia: Secondary | ICD-10-CM | POA: Diagnosis not present

## 2015-09-30 DIAGNOSIS — E782 Mixed hyperlipidemia: Secondary | ICD-10-CM | POA: Diagnosis not present

## 2015-09-30 DIAGNOSIS — Z124 Encounter for screening for malignant neoplasm of cervix: Secondary | ICD-10-CM | POA: Insufficient documentation

## 2015-09-30 DIAGNOSIS — I129 Hypertensive chronic kidney disease with stage 1 through stage 4 chronic kidney disease, or unspecified chronic kidney disease: Secondary | ICD-10-CM | POA: Diagnosis not present

## 2015-09-30 DIAGNOSIS — N183 Chronic kidney disease, stage 3 (moderate): Secondary | ICD-10-CM | POA: Diagnosis not present

## 2015-10-02 LAB — CYTOLOGY - PAP

## 2015-11-04 ENCOUNTER — Other Ambulatory Visit: Payer: Self-pay

## 2015-11-04 DIAGNOSIS — Z1231 Encounter for screening mammogram for malignant neoplasm of breast: Secondary | ICD-10-CM

## 2015-11-20 ENCOUNTER — Ambulatory Visit
Admission: RE | Admit: 2015-11-20 | Discharge: 2015-11-20 | Disposition: A | Discharge status: Home / Self Care | Payer: 59 | Source: Ambulatory Visit

## 2015-11-20 DIAGNOSIS — Z1231 Encounter for screening mammogram for malignant neoplasm of breast: Secondary | ICD-10-CM

## 2016-02-19 ENCOUNTER — Encounter: Payer: Self-pay | Admitting: *Deleted

## 2016-03-31 DIAGNOSIS — N183 Chronic kidney disease, stage 3 (moderate): Secondary | ICD-10-CM | POA: Diagnosis not present

## 2016-03-31 DIAGNOSIS — I129 Hypertensive chronic kidney disease with stage 1 through stage 4 chronic kidney disease, or unspecified chronic kidney disease: Secondary | ICD-10-CM | POA: Diagnosis not present

## 2016-03-31 DIAGNOSIS — M545 Low back pain: Secondary | ICD-10-CM | POA: Diagnosis not present

## 2016-03-31 DIAGNOSIS — E785 Hyperlipidemia, unspecified: Secondary | ICD-10-CM | POA: Diagnosis not present

## 2016-03-31 MED FILL — CYCLOBENZAPRINE 10 MG TAB: 10 | 90 days supply | Qty: 90 | Fill #0

## 2016-03-31 MED FILL — METOPROLOL SUCC ER 25 MG TA: 25 | 90 days supply | Qty: 90 | Fill #0

## 2016-04-01 MED FILL — ATORVASTATIN 20 MG TABLET: 20 | 90 days supply | Qty: 90 | Fill #0

## 2016-07-08 ENCOUNTER — Ambulatory Visit (INDEPENDENT_AMBULATORY_CARE_PROVIDER_SITE_OTHER): Payer: 59

## 2016-07-08 ENCOUNTER — Ambulatory Visit (INDEPENDENT_AMBULATORY_CARE_PROVIDER_SITE_OTHER): Payer: 59 | Admitting: Podiatry

## 2016-07-08 ENCOUNTER — Encounter: Payer: Self-pay | Admitting: Podiatry

## 2016-07-08 DIAGNOSIS — M25572 Pain in left ankle and joints of left foot: Secondary | ICD-10-CM

## 2016-07-08 DIAGNOSIS — S8264XA Nondisplaced fracture of lateral malleolus of right fibula, initial encounter for closed fracture: Secondary | ICD-10-CM

## 2016-07-08 DIAGNOSIS — M7751 Other enthesopathy of right foot: Secondary | ICD-10-CM

## 2016-07-08 DIAGNOSIS — M659 Synovitis and tenosynovitis, unspecified: Secondary | ICD-10-CM | POA: Diagnosis not present

## 2016-07-08 DIAGNOSIS — M25571 Pain in right ankle and joints of right foot: Secondary | ICD-10-CM | POA: Diagnosis not present

## 2016-07-08 MED ORDER — NONFORMULARY OR COMPOUNDED ITEM: 1.0000 g | Freq: Four times a day (QID) | 2 refills | Status: DC

## 2016-07-12 MED ORDER — BETAMETHASONE SOD PHOS & ACET 6 (3-3) MG/ML IJ SUSP: 3.0000 mg | Freq: Once | INTRAMUSCULAR | Status: AC

## 2016-07-12 NOTE — Progress Notes (Signed)
Subjective:  Patient presents today for pain and tenderness to the right ankle. Patient relates significant pain and tenderness when walking. Patient states that the pain is been ongoing for approximately 6-7 months now. Patient presents for further treatment and evaluation.    Objective / Physical Exam:  General:  The patient is alert and oriented x3 in no acute distress. Dermatology:  Skin is warm, dry and supple bilateral lower extremities. Negative for open lesions or macerations. Vascular:  Palpable pedal pulses bilaterally. No edema or erythema noted. Capillary refill within normal limits. Neurological:  Epicritic and protective threshold grossly intact bilaterally.  Musculoskeletal Exam:  Pain on palpation to the lateral aspect of the patient's left ankle. Mild edema noted.  Range of motion within normal limits to all pedal and ankle joints bilateral. Muscle strength 5/5 in all groups bilateral.   Radiographic Exam:  There is a nondisplaced, closed, fracture to the fibular malleolus of the right ankle. Fracture appears chronic with evidence of callus formation. All other joints in osseous structures appear within normal limits  Assessment: #1 pain in right ankle #2 synovitis of right ankle #3 capsulitis of right ankle #4 fibular malleolus ankle fracture right-nondisplaced, closed  Plan of Care:  #1 Patient was evaluated. #2 injection of 0.5 mL Celestone Soluspan injected in the patient's right ankle. #3 prescription for anti-inflammatory pain cream [facility]  #4 ankle brace dispensed #5 patient is to return to clinic in 4 weeks   Dr. Felecia ShellingBrent M. Ameriah Lint, DPM [facility]

## 2016-07-17 DIAGNOSIS — E7800 Pure hypercholesterolemia, unspecified: Secondary | ICD-10-CM | POA: Diagnosis not present

## 2016-08-05 ENCOUNTER — Ambulatory Visit (INDEPENDENT_AMBULATORY_CARE_PROVIDER_SITE_OTHER): Payer: 59 | Admitting: Podiatry

## 2016-08-05 DIAGNOSIS — M65971 Unspecified synovitis and tenosynovitis, right ankle and foot: Secondary | ICD-10-CM

## 2016-08-05 DIAGNOSIS — M25571 Pain in right ankle and joints of right foot: Secondary | ICD-10-CM | POA: Diagnosis not present

## 2016-08-05 DIAGNOSIS — M659 Synovitis and tenosynovitis, unspecified: Secondary | ICD-10-CM

## 2016-08-05 DIAGNOSIS — M7751 Other enthesopathy of right foot: Secondary | ICD-10-CM

## 2016-08-09 NOTE — Progress Notes (Signed)
Subjective:  Patient presents today for follow-up evaluation of pain and tenderness to the right ankle. Patient states that she is feeling much better. She notices significant improvement. Patient states that the brace which was dispensed on the last visit helped. She also believes the injection helped. She is currently using anti-inflammatory pain cream through [facility]. Patient presents today for further treatment and evaluation    Objective/Physical Exam General: The patient is alert and oriented x3 in no acute distress.  Dermatology: Skin is warm, dry and supple bilateral lower extremities. Negative for open lesions or macerations.  Vascular: Palpable pedal pulses bilaterally. No edema or erythema noted. Capillary refill within normal limits.  Neurological: Epicritic and protective threshold grossly intact bilaterally.   Musculoskeletal Exam: Range of motion within normal limits to all pedal and ankle joints bilateral. Muscle strength 5/5 in all groups bilateral.   Assessment: #1 pain in right ankle-resolved #2 synovitis and capsulitis of right ankle-resolved   Plan of Care:  #1 Patient was evaluated. #2 today the patient was evaluated at length. Patient has healed. Patient has no longer symptomatic. Today we discussed the conservative modalities to prevent recurrence. At least 20 minutes of time was spent explaining conservative modalities to prevent recurrence. #3 continue anti-inflammatory pain cream [facility] as needed #4 continue ankle brace as needed #5 return to clinic when necessary   Dr. Felecia ShellingBrent M. Reta Norgren, DPM [facility]

## 2016-10-13 DIAGNOSIS — E785 Hyperlipidemia, unspecified: Secondary | ICD-10-CM | POA: Diagnosis not present

## 2016-10-13 DIAGNOSIS — M545 Low back pain: Secondary | ICD-10-CM | POA: Diagnosis not present

## 2016-10-13 DIAGNOSIS — M25569 Pain in unspecified knee: Secondary | ICD-10-CM | POA: Diagnosis not present

## 2016-10-13 DIAGNOSIS — M25511 Pain in right shoulder: Secondary | ICD-10-CM | POA: Diagnosis not present

## 2016-10-13 DIAGNOSIS — N183 Chronic kidney disease, stage 3 (moderate): Secondary | ICD-10-CM | POA: Diagnosis not present

## 2016-10-13 DIAGNOSIS — I129 Hypertensive chronic kidney disease with stage 1 through stage 4 chronic kidney disease, or unspecified chronic kidney disease: Secondary | ICD-10-CM | POA: Diagnosis not present

## 2016-10-13 DIAGNOSIS — Z0000 Encounter for general adult medical examination without abnormal findings: Secondary | ICD-10-CM | POA: Diagnosis not present

## 2016-10-13 DIAGNOSIS — I471 Supraventricular tachycardia: Secondary | ICD-10-CM | POA: Diagnosis not present

## 2016-10-26 MED FILL — ATORVASTATIN 80 MG TABLET: 80 | 90 days supply | Qty: 90 | Fill #0

## 2016-10-28 ENCOUNTER — Ambulatory Visit (INDEPENDENT_AMBULATORY_CARE_PROVIDER_SITE_OTHER): Payer: 59 | Admitting: Physician Assistant

## 2016-10-28 ENCOUNTER — Ambulatory Visit (INDEPENDENT_AMBULATORY_CARE_PROVIDER_SITE_OTHER): Payer: Self-pay

## 2016-10-28 ENCOUNTER — Encounter (INDEPENDENT_AMBULATORY_CARE_PROVIDER_SITE_OTHER): Payer: Self-pay | Admitting: Physician Assistant

## 2016-10-28 DIAGNOSIS — G8929 Other chronic pain: Secondary | ICD-10-CM

## 2016-10-28 DIAGNOSIS — M25561 Pain in right knee: Secondary | ICD-10-CM | POA: Diagnosis not present

## 2016-10-28 DIAGNOSIS — M25511 Pain in right shoulder: Secondary | ICD-10-CM | POA: Diagnosis not present

## 2016-10-28 MED ORDER — LIDOCAINE HCL 1 % IJ SOLN
3.0000 mL | INTRAMUSCULAR | Status: AC | PRN
  Administered 2016-10-28: 3 mL

## 2016-10-28 MED ORDER — METHYLPREDNISOLONE ACETATE 40 MG/ML IJ SUSP
40.0000 mg | INTRAMUSCULAR | Status: AC | PRN
  Administered 2016-10-28: 40 mg via INTRA_ARTICULAR

## 2016-10-28 NOTE — Progress Notes (Signed)
Office Visit Note   Patient: Traci BrighamChristiana R Eilers           Date of Birth: Jun 09, 1956           MRN: [id number] Visit Date: 10/28/2016              Requested by: No referring provider defined for this encounter. PCP: No PCP Per Patient   Assessment & Plan: Visit Diagnoses:  1. Chronic pain of right knee   2. Chronic right shoulder pain     Plan: Quad strengthening exercises for her knees. Wall crawls, pendulum exercises, Codman exercises and forward flexion exercises. All of these exercises were shown and had the patient demonstrate these. For now I would not add any anti-inflammatory as she's been told that she has high liver and kidney function is been advised to use Aleve sparingly by her primary care physician per patient.See her back in 2 weeks' check progress lack of.  Follow-Up Instructions: Return in about 2 weeks (around 11/11/2016).   Orders:  Orders Placed This Encounter  Procedures  . Large Joint Injection/Arthrocentesis  . Large Joint Injection/Arthrocentesis  . XR KNEE 3 VIEW RIGHT  . XR Shoulder Right   No orders of the defined types were placed in this encounter.     Procedures: Large Joint Inj Date/Time: 10/28/2016 2:10 PM Performed by: Kirtland BouchardLARK, GILBERT W Authorized by: Kirtland BouchardLARK, GILBERT W   Consent Given by:  Patient Indications:  Pain Location:  Knee Site:  R knee Needle Size:  22 G Approach:  Anterolateral Ultrasound Guidance: No   Fluoroscopic Guidance: No   Medications:  40 mg methylPREDNISolone acetate 40 MG/ML; 3 mL lidocaine 1 % Aspiration Attempted: No   Patient tolerance:  Patient tolerated the procedure well with no immediate complications Large Joint Inj Date/Time: 10/28/2016 2:11 PM Performed by: Kirtland BouchardLARK, GILBERT W Authorized by: Kirtland BouchardLARK, GILBERT W   Consent Given by:  Patient Indications:  Pain Location:  Shoulder Site:  R subacromial bursa Needle Size:  22 G Needle Length:  1.5 inches Approach:  Anterolateral Ultrasound Guidance: No     Fluoroscopic Guidance: No   Arthrogram: No   Medications:  40 mg methylPREDNISolone acetate 40 MG/ML; 3 mL lidocaine 1 % Aspiration Attempted: No   Patient tolerance:  Patient tolerated the procedure well with no immediate complications     Clinical Data: No additional findings.   Subjective: Chief Complaint  Patient presents with  . Right Shoulder - Pain  . Right Knee - Pain    HPI [name] is a 61 -year-old female with right knee and right shoulder pain. She states pain is been ongoing for some time. She's definitely had right shoulder pain since she fell 3 years ago and broke both forearms. At that time she developed a frozen shoulder and underwent a injection in her shoulder that only helped for short period time. She states that right knee crack with weightbearing painful with weightbearing. She also has pain if she sits for prolonged period time of the right knee. She's had no physical therapy she has tried some Aleve but is been told to be sparing with this due to high kidney function per patient Review of Systems   Objective: Vital Signs: There were no vitals taken for this visit.  Physical Exam Developmental well-nourished female in no acute distress. Affect appropriate. Psychiatric alert 3. Radial pulses are 2+ bilaterally and equal symmetric. Ortho Exam Bilateral shoulders external rotation 5 out of 5 strength against resistance. Empty can  test negative bilaterally. Right shoulder positive impingement testing on the right. Crease for flexion of the right shoulder, and 160 forward flexion 180 on the left. Bilateral hands full sensation and full motor. Bilateral knees no rashes skin lesions ulcerations erythema ecchymosis or effusion. Range of motion of both knees will right knee crepitus with passive range of motion. Positive Manfred Shirts sign on the right Negative on the left. Instability valgus varus stressing of either knee. Anterior drawer is negative  bilaterally McMurray's negative bilaterally. The knee with valgus deformity. Specialty Comments:  No specialty comments available.  Imaging: Xr Knee 3 View Right  Result Date: 10/28/2016 AP sunrise view and lateral view of the right knee: No acute fractures . The lateral joint line of both knees on the AP view are well preserved. Osteophytes off the lateral aspect of the femoral condyle and the tibial plateau right knee. Sunrise view shows lateralization of the right patella within the patellofemoral notch.  Xr Shoulder Right  Result Date: 10/28/2016 3 views right shoulder: Shoulder well located. Glenohumeral joints were well preserved. Downsloping acromion. Subacromial space otherwise well-maintained on the Y view. No acute fracture bony abnormalities otherwise    PMFS History: Patient Active Problem List   Diagnosis Date Noted  . Preventative health care 06/18/2015  . Paroxysmal supraventricular tachycardia (HCC) 06/18/2015  . Abdominal pain, epigastric 06/18/2015  . Leukopenia 10/16/2014  . Rhabdomyolysis 10/09/2014  . Anemia 05/02/2014  . Thoracic spine pain 03/01/2014  . Hypertension 07/08/2012  . CKD (chronic kidney disease) stage 3, GFR 30-59 ml/min 07/08/2012  . HLD (hyperlipidemia) 12/15/2011  . Leg pain 04/15/2011   Past Medical History:  Diagnosis Date  . CKD (chronic kidney disease) stage 3, GFR 30-59 ml/min 07/08/2012  . High cholesterol   . Hypertension 07/08/2012  . Left-sided chest wall pain 07/08/2012  . Leg cramps 04/15/2011   Pt was having cramps in the thigh     Family History  Problem Relation Age of Onset  . Hypertension Mother   . Heart disease Neg Hx     Past Surgical History:  Procedure Laterality Date  . BACK SURGERY    . CHOLECYSTECTOMY    . womb tied (in [place] exact procedure unknown)     Social History   Occupational History  . 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 Smoking status: Never Smoker  . Smokeless tobacco:  Never Used  . Alcohol use No  . Drug use: No  . Sexual activity: No

## 2016-11-11 ENCOUNTER — Ambulatory Visit (INDEPENDENT_AMBULATORY_CARE_PROVIDER_SITE_OTHER): Payer: 59 | Admitting: Physician Assistant

## 2016-11-11 DIAGNOSIS — G8929 Other chronic pain: Secondary | ICD-10-CM

## 2016-11-11 DIAGNOSIS — M25561 Pain in right knee: Secondary | ICD-10-CM | POA: Diagnosis not present

## 2016-11-11 DIAGNOSIS — M25511 Pain in right shoulder: Secondary | ICD-10-CM | POA: Diagnosis not present

## 2016-11-11 NOTE — Progress Notes (Signed)
Office Visit Note   Patient: Traci Brown           Date of Birth: [birth year]           MRN: [id number] Visit Date: 11/11/2016              Requested by: No referring provider defined for this encounter. PCP: No PCP Per Patient   Assessment & Plan: Visit Diagnoses:  1. Chronic pain of right knee   2. Chronic right shoulder pain     Plan: She will continue to work on quad strengthening. Regards to muscle spasms she has Flexeril she can take this encouraged frankly of fluids. Follow up as needed otherwise.  Follow-Up Instructions: Return if symptoms worsen or fail to improve.   Orders:  No orders of the defined types were placed in this encounter.  No orders of the defined types were placed in this encounter.     Procedures: No procedures performed   Clinical Data: No additional findings.   Subjective: No chief complaint on file.   HPI  Traci Brown turns today follow-up of her right knee and right shoulder pain. She's 2 weeks status post cortisone injection both knees but the knee and the shoulder. She states overall that she is doing much better. She is doing her quad strengthening exercises for her right leg. Her only complaint is she is having increased cramps she startes she spoke with her primary care physician about this.Reports  lab work was all normal. She was given some Flexeril which helped at first but now she feels that it causes her cramps to actually be worse. Review of Systems   Objective: Vital Signs: There were no vitals taken for this visit.  Physical Exam  Constitutional: She is oriented to person, place, and time. She appears well-developed and well-nourished. No distress.  Neurological: She is alert and oriented to person, place, and time.  Psychiatric: She has a normal mood and affect. Her behavior is normal.    Ortho Exam Right knee she has full range of motion. Passive range of motion reveals crepitus peripatellar region. No  tenderness with palpation about the knee. No effusion abnormal warmth erythema.  Upper extremity she has 5 out of 5 strengths with external and internal rotation against resistance bilateral shoulders. Empty can test is negative bilaterally. Impingement testing on the right is negative.  Specialty Comments:  No specialty comments available.  Imaging: No results found.   PMFS History: Patient Active Problem List   Diagnosis Date Noted  . Preventative health care 06/18/2015  . Paroxysmal supraventricular tachycardia (HCC) 06/18/2015  . Abdominal pain, epigastric 06/18/2015  . Leukopenia 10/16/2014  . Rhabdomyolysis 10/09/2014  . Anemia 05/02/2014  . Thoracic spine pain 03/01/2014  . Hypertension 07/08/2012  . CKD (chronic kidney disease) stage 3, GFR 30-59 ml/min 07/08/2012  . HLD (hyperlipidemia) 12/15/2011  . Leg pain 04/15/2011   Past Medical History:  Diagnosis Date  . CKD (chronic kidney disease) stage 3, GFR 30-59 ml/min 07/08/2012  . High cholesterol   . Hypertension 07/08/2012  . Left-sided chest wall pain 07/08/2012  . Leg cramps 04/15/2011   Pt was having cramps in the thigh     Family History  Problem Relation Age of Onset  . Hypertension Mother   . Heart disease Neg Hx     Past Surgical History:  Procedure Laterality Date  . BACK SURGERY    . CHOLECYSTECTOMY    . womb tied (in [place] exact procedure  unknown)     Social History   Occupational History  . 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 Smoking status: Never Smoker  . Smokeless tobacco: Never Used  . Alcohol use No  . Drug use: No  . Sexual activity: No

## 2016-12-11 ENCOUNTER — Other Ambulatory Visit: Payer: Self-pay | Admitting: Family Medicine

## 2016-12-11 DIAGNOSIS — Z1231 Encounter for screening mammogram for malignant neoplasm of breast: Secondary | ICD-10-CM

## 2016-12-31 ENCOUNTER — Ambulatory Visit: Payer: 59

## 2017-01-11 ENCOUNTER — Ambulatory Visit
Admission: RE | Admit: 2017-01-11 | Discharge: 2017-01-11 | Disposition: A | Discharge status: Home / Self Care | Payer: 59 | Source: Ambulatory Visit | Attending: Family Medicine | Admitting: Family Medicine

## 2017-01-11 DIAGNOSIS — Z1231 Encounter for screening mammogram for malignant neoplasm of breast: Secondary | ICD-10-CM | POA: Diagnosis not present

## 2017-01-25 DIAGNOSIS — E785 Hyperlipidemia, unspecified: Secondary | ICD-10-CM | POA: Diagnosis not present

## 2017-01-27 MED FILL — ATORVASTATIN 80 MG TABLET: 80 | 90 days supply | Qty: 90 | Fill #0

## 2017-02-23 MED FILL — AZITHROMYCIN 500 MG TABLET: 500 | 3 days supply | Qty: 3 | Fill #0

## 2017-02-23 MED FILL — ATOVAQUONE-PROGUANIL 250-10: 250-100 | 45 days supply | Qty: 45 | Fill #0

## 2017-02-24 DIAGNOSIS — N952 Postmenopausal atrophic vaginitis: Secondary | ICD-10-CM | POA: Diagnosis not present

## 2017-02-24 DIAGNOSIS — Z01419 Encounter for gynecological examination (general) (routine) without abnormal findings: Secondary | ICD-10-CM | POA: Diagnosis not present

## 2017-04-12 DIAGNOSIS — K5900 Constipation, unspecified: Secondary | ICD-10-CM | POA: Diagnosis not present

## 2017-04-12 DIAGNOSIS — N183 Chronic kidney disease, stage 3 (moderate): Secondary | ICD-10-CM | POA: Diagnosis not present

## 2017-04-12 DIAGNOSIS — I129 Hypertensive chronic kidney disease with stage 1 through stage 4 chronic kidney disease, or unspecified chronic kidney disease: Secondary | ICD-10-CM | POA: Diagnosis not present

## 2017-04-12 DIAGNOSIS — E785 Hyperlipidemia, unspecified: Secondary | ICD-10-CM | POA: Diagnosis not present

## 2017-07-23 ENCOUNTER — Emergency Department (HOSPITAL_COMMUNITY)
Admission: EM | Admit: 2017-07-23 | Discharge: 2017-07-23 | Disposition: A | Discharge status: Home / Self Care | Payer: 59 | Attending: Emergency Medicine | Admitting: Emergency Medicine

## 2017-07-23 ENCOUNTER — Encounter (HOSPITAL_COMMUNITY): Payer: Self-pay | Admitting: Emergency Medicine

## 2017-07-23 DIAGNOSIS — N183 Chronic kidney disease, stage 3 (moderate): Secondary | ICD-10-CM | POA: Diagnosis not present

## 2017-07-23 DIAGNOSIS — E876 Hypokalemia: Secondary | ICD-10-CM | POA: Insufficient documentation

## 2017-07-23 DIAGNOSIS — I10 Essential (primary) hypertension: Secondary | ICD-10-CM

## 2017-07-23 DIAGNOSIS — R000 Tachycardia, unspecified: Secondary | ICD-10-CM | POA: Diagnosis not present

## 2017-07-23 DIAGNOSIS — Z79899 Other long term (current) drug therapy: Secondary | ICD-10-CM | POA: Insufficient documentation

## 2017-07-23 DIAGNOSIS — I129 Hypertensive chronic kidney disease with stage 1 through stage 4 chronic kidney disease, or unspecified chronic kidney disease: Secondary | ICD-10-CM | POA: Diagnosis not present

## 2017-07-23 LAB — CBC WITH DIFFERENTIAL/PLATELET
BASOS ABS: 0 10*3/uL (ref 0.0–0.1)
BASOS PCT: 0 %
EOS ABS: 0.2 10*3/uL (ref 0.0–0.7)
Eosinophils Relative: 7 %
HCT: 31.1 % — ABNORMAL LOW (ref 36.0–46.0)
Hemoglobin: 9.8 g/dL — ABNORMAL LOW (ref 12.0–15.0)
LYMPHS ABS: 1 10*3/uL (ref 0.7–4.0)
Lymphocytes Relative: 38 %
MCH: 26.9 pg (ref 26.0–34.0)
MCHC: 31.5 g/dL (ref 30.0–36.0)
MCV: 85.4 fL (ref 78.0–100.0)
MONO ABS: 0.2 10*3/uL (ref 0.1–1.0)
MONOS PCT: 6 %
NEUTROS ABS: 1.2 10*3/uL — AB (ref 1.7–7.7)
Neutrophils Relative %: 49 %
PLATELETS: 161 10*3/uL (ref 150–400)
RBC: 3.64 MIL/uL — AB (ref 3.87–5.11)
RDW: 15 % (ref 11.5–15.5)
WBC: 2.5 10*3/uL — ABNORMAL LOW (ref 4.0–10.5)

## 2017-07-23 LAB — BASIC METABOLIC PANEL
ANION GAP: 7 (ref 5–15)
BUN: 17 mg/dL (ref 6–20)
CALCIUM: 9.3 mg/dL (ref 8.9–10.3)
CO2: 26 mmol/L (ref 22–32)
Chloride: 103 mmol/L (ref 101–111)
Creatinine, Ser: 1.27 mg/dL — ABNORMAL HIGH (ref 0.44–1.00)
GFR calc Af Amer: 52 mL/min — ABNORMAL LOW (ref >60)
GFR, EST NON AFRICAN AMERICAN: 45 mL/min — AB (ref >60)
Glucose, Bld: 99 mg/dL (ref 65–99)
POTASSIUM: 3.2 mmol/L — AB (ref 3.5–5.1)
SODIUM: 136 mmol/L (ref 135–145)

## 2017-07-23 MED ORDER — POTASSIUM CHLORIDE ER 10 MEQ PO TBCR: 10.0000 meq | EXTENDED_RELEASE_TABLET | Freq: Two times a day (BID) | ORAL | 0 refills | Status: DC

## 2017-07-23 MED ORDER — POTASSIUM CHLORIDE CRYS ER 20 MEQ PO TBCR: 40.0000 meq | EXTENDED_RELEASE_TABLET | Freq: Once | ORAL | Status: DC

## 2017-07-23 MED ORDER — METOPROLOL TARTRATE 25 MG PO TABS
25.0000 mg | ORAL_TABLET | Freq: Once | ORAL | Status: AC
  Administered 2017-07-23: 25 mg via ORAL
  Filled 2017-07-23: qty 1

## 2017-07-23 MED ORDER — METOPROLOL TARTRATE 25 MG PO TABS: 25.0000 mg | ORAL_TABLET | Freq: Two times a day (BID) | ORAL | 2 refills | Status: DC

## 2017-07-23 MED ORDER — SODIUM CHLORIDE 0.9 % IV BOLUS (SEPSIS)
1000.0000 mL | Freq: Once | INTRAVENOUS | Status: AC
  Administered 2017-07-23: 1000 mL via INTRAVENOUS

## 2017-07-23 MED FILL — Medication: Qty: 1 | Status: AC

## 2017-07-23 NOTE — ED Provider Notes (Signed)
[facility] EMERGENCY DEPARTMENT Provider Note   CSN: [id number] Arrival date & time: 07/23/17  1025     History   Chief Complaint Chief Complaint  Patient presents with  . Tachycardia    HPI Traci Brown is a 61 y.o. female.  HPI    Traci Brown is a 61 y.o. female, with a history of CKD stage 3, HTN, hypercholesterolemia, and SVT, presenting to the ED with rapid heart rate.  Was pushing a patient during transport and began to feel lightheaded. Sat down and felt her heart racing. Was was placed on a stretcher and given supplemental O2, which seemed to resolve her symptoms. HR reportedly in the 180s. Currently without complaints.  Has been eating and drinking normally. No new vitamins, supplements, or diets.   Has had a previous episode of SVT that she states felt similar. Was evaluated by cardiologist, Dr.  Elberta Fortis, at that time and cleared. She was prescribed metoprolol, however, has not taken it in months because she did not think she still needed it.  Denies CP, SOB, nausea/vomiting, abdominal pain, back pain, diaphoresis, recent illness, fever, or any other complaints.    Past Medical History:  Diagnosis Date  . CKD (chronic kidney disease) stage 3, GFR 30-59 ml/min (HCC) 07/08/2012  . High cholesterol   . Hypertension 07/08/2012  . Left-sided chest wall pain 07/08/2012  . Leg cramps 04/15/2011   Pt was having cramps in the thigh     Patient Active Problem List   Diagnosis Date Noted  . Preventative health care 06/18/2015  . Paroxysmal supraventricular tachycardia (HCC) 06/18/2015  . Abdominal pain, epigastric 06/18/2015  . Leukopenia 10/16/2014  . Rhabdomyolysis 10/09/2014  . Anemia 05/02/2014  . Thoracic spine pain 03/01/2014  . Hypertension 07/08/2012  . CKD (chronic kidney disease) stage 3, GFR 30-59 ml/min (HCC) 07/08/2012  . HLD (hyperlipidemia) 12/15/2011  . Leg pain 04/15/2011    Past Surgical History:  Procedure  Laterality Date  . BACK SURGERY    . CHOLECYSTECTOMY    . womb tied (in [place] exact procedure unknown)      OB History    No data available       Home Medications    Prior to Admission medications   Medication Sig Start Date End Date Taking? Authorizing Provider  atorvastatin (LIPITOR) 20 MG tablet  04/01/16   [provider]  cyclobenzaprine (FLEXERIL) 10 MG tablet Take 1 tablet (10 mg total) by mouth 3 (three) times daily as needed for muscle spasms. 08/27/15   Emokpae, Ejiroghene E, MD  gemfibrozil (LOPID) 600 MG tablet Take 1 tablet (600 mg total) by mouth 2 (two) times daily before a meal. 08/27/15   Emokpae, Ejiroghene E, MD  Green Tea, Camillia sinensis, (GREEN TEA PO) Take 1 tablet by mouth daily.    [provider]  metoprolol tartrate (LOPRESSOR) 25 MG tablet Take 1 tablet (25 mg total) 2 (two) times daily by mouth. 07/23/17   Cyara Devoto C, PA-C  NONFORMULARY OR COMPOUNDED ITEM Apply 1-2 g topically 4 (four) times daily. 07/08/16   Felecia Shelling, DPM  omeprazole (PRILOSEC) 20 MG capsule Take 1 capsule (20 mg total) by mouth daily. 08/27/15   Emokpae, Ejiroghene E, MD  potassium chloride (K-DUR) 10 MEQ tablet Take 1 tablet (10 mEq total) 2 (two) times daily for 5 days by mouth. 07/23/17 07/28/17  Regginald Pask, Hillard Danker, PA-C    Family History Family History  Problem Relation Age of Onset  .  Hypertension Mother   . Heart disease Neg Hx     Social History Social History   Tobacco Use  . Smoking status: Never Smoker  . Smokeless tobacco: Never Used  Substance Use Topics  . Alcohol use: No  . Drug use: No     Allergies   Patient has no known allergies.   Review of Systems Review of Systems  Constitutional: Negative for chills, diaphoresis and fever.  Respiratory: Negative for shortness of breath.   Cardiovascular: Negative for chest pain.       Tachycardia  Gastrointestinal: Negative for abdominal pain, nausea and vomiting.  Musculoskeletal:  Negative for back pain.  Neurological: Positive for light-headedness (resolved). Negative for syncope and weakness.  All other systems reviewed and are negative.    Physical Exam Updated Vital Signs BP 139/83 (BP Location: Right Arm)   Pulse 81   Temp 97.7 F (36.5 C) (Oral)   Resp 14   SpO2 97%   Physical Exam  Constitutional: She appears well-developed and well-nourished. No distress.  HENT:  Head: Normocephalic and atraumatic.  Eyes: Conjunctivae are normal.  Neck: Neck supple.  Cardiovascular: Normal rate, regular rhythm, normal heart sounds and intact distal pulses.  Pulmonary/Chest: Effort normal and breath sounds normal. No respiratory distress.  No increased work of breathing noted.  Abdominal: Soft. There is no tenderness. There is no guarding.  Musculoskeletal: She exhibits no edema.  Lymphadenopathy:    She has no cervical adenopathy.  Neurological: She is alert.  Skin: Skin is warm and dry. Capillary refill takes less than 2 seconds. She is not diaphoretic.  Psychiatric: She has a normal mood and affect. Her behavior is normal.  Nursing note and vitals reviewed.    ED Treatments / Results  Labs (all labs ordered are listed, but only abnormal results are displayed) Labs Reviewed  CBC WITH DIFFERENTIAL/PLATELET - Abnormal; Notable for the following components:      Result Value   WBC 2.5 (*)    RBC 3.64 (*)    Hemoglobin 9.8 (*)    HCT 31.1 (*)    Neutro Abs 1.2 (*)    All other components within normal limits  BASIC METABOLIC PANEL - Abnormal; Notable for the following components:   Potassium 3.2 (*)    Creatinine, Ser 1.27 (*)    GFR calc non Af Amer 45 (*)    GFR calc Af Amer 52 (*)    All other components within normal limits    EKG  EKG Interpretation  Date/Time:  Friday July 23 2017 10:33:26 EST Ventricular Rate:  84 PR Interval:    QRS Duration: 89 QT Interval:  429 QTC Calculation: 508 R Axis:   9 Text Interpretation:  Sinus  rhythm Prolonged PR interval Borderline prolonged QT interval No significant change since last tracing Confirmed by Shaune PollackIsaacs, Cameron [phone]([id number]) on 07/23/2017 11:12:16 AM       Radiology No results found.  Procedures Procedures (including critical care time)  Medications Ordered in ED Medications  potassium chloride SA (K-DUR,KLOR-CON) CR tablet 40 mEq (not administered)  sodium chloride 0.9 % bolus 1,000 mL (0 mLs Intravenous Stopped 07/23/17 1322)  metoprolol tartrate (LOPRESSOR) tablet 25 mg (25 mg Oral Given 07/23/17 1200)     Initial Impression / Assessment and Plan / ED Course  I have reviewed the triage vital signs and the nursing notes.  Pertinent labs & imaging results that were available during my care of the patient were reviewed by me and considered in  my medical decision making (see chart for details).  Clinical Course as of Jul 24 1539  Fri Jul 23, 2017  1119 Spoke with Dr. Theodoro Clock, cardiologist on call.  Agrees with the plan for restarting the patient's metoprolol and office follow-up.  [SJ]  1205 This value appears to be consistent with previous values.  WBC: (!) 2.5 [SJ]  1206 Consistent with previous values. Hemoglobin: (!) 9.8 [SJ]    Clinical Course User Index [SJ] Bellamarie Pflug C, PA-C    Patient presents with an episode of reported tachycardia.  No acute abnormalities noted on labs. I do not think the patient's reported SVT was caused by a primary ischemic event. No instance of SVT during ED course.  Patient complaint-free through her ED course.  Patient restarted on her metoprolol.  Cardiology follow-up. The patient was given instructions for home care as well as return precautions. Patient voices understanding of these instructions, accepts the plan, and is comfortable with discharge.  Findings and plan of care discussed with Shaune Pollack, MD.   Vitals:   07/23/17 1045 07/23/17 1200 07/23/17 1300 07/23/17 1308  BP: 140/85 134/85    Pulse: 85 (!) 118 78 76    Resp: Temp:      TempSrc:      SpO2: 96% 98% 100% 100%    Orthostatic VS for the past 24 hrs:  BP- Lying Pulse- Lying BP- Sitting Pulse- Sitting BP- Standing at 0 minutes Pulse- Standing at 0 minutes  07/23/17 1142 125/76 85 135/87 91 125/81 88    Final Clinical Impressions(s) / ED Diagnoses   Final diagnoses:  Tachycardia  Hypokalemia    ED Discharge Orders        Ordered    metoprolol tartrate (LOPRESSOR) 25 MG tablet  2 times daily     07/23/17 1122    potassium chloride (K-DUR) 10 MEQ tablet  2 times daily     07/23/17 1208       Anselm Pancoast, PA-C 07/23/17 1541    Anselm Pancoast, PA-C 07/23/17 1543    Shaune Pollack, MD 07/23/17 1746

## 2017-07-23 NOTE — Discharge Instructions (Signed)
Please start taking your metoprolol again, as directed.  Follow-up with the cardiologist as soon as possible.  Return to the ED for any recurrent or worsening symptoms.  Your white blood cell count was low today, but seems to have been low before. Follow up with a primary care provider on this matter.  Your potassium was lower than normal today.  Please follow-up with your primary care provider for retesting in about a week.

## 2017-07-23 NOTE — ED Triage Notes (Signed)
Pt here from IR with SVT , hx of same , HR 180 on arrival but converted to SR after arrival

## 2017-07-28 MED FILL — METOPROLOL TARTRATE 25 MG T: 25 | 30 days supply | Qty: 60 | Fill #0

## 2017-07-28 MED FILL — POTASSIUM CL 10 MEQ TAB SA: 10 | 5 days supply | Qty: 10 | Fill #0

## 2017-08-17 ENCOUNTER — Institutional Professional Consult (permissible substitution): Payer: 59 | Admitting: Cardiology

## 2017-09-01 ENCOUNTER — Encounter: Payer: Self-pay | Admitting: Cardiology

## 2017-10-07 ENCOUNTER — Encounter: Payer: Self-pay | Admitting: Cardiology

## 2017-10-18 DIAGNOSIS — I471 Supraventricular tachycardia: Secondary | ICD-10-CM | POA: Diagnosis not present

## 2017-10-18 DIAGNOSIS — M545 Low back pain: Secondary | ICD-10-CM | POA: Diagnosis not present

## 2017-10-18 DIAGNOSIS — I129 Hypertensive chronic kidney disease with stage 1 through stage 4 chronic kidney disease, or unspecified chronic kidney disease: Secondary | ICD-10-CM | POA: Diagnosis not present

## 2017-10-18 DIAGNOSIS — Z0000 Encounter for general adult medical examination without abnormal findings: Secondary | ICD-10-CM | POA: Diagnosis not present

## 2017-10-18 DIAGNOSIS — E785 Hyperlipidemia, unspecified: Secondary | ICD-10-CM | POA: Diagnosis not present

## 2017-10-18 DIAGNOSIS — N183 Chronic kidney disease, stage 3 (moderate): Secondary | ICD-10-CM | POA: Diagnosis not present

## 2017-11-30 MED FILL — METOPROLOL TARTRATE 25 MG T: 25 | 90 days supply | Qty: 180 | Fill #0

## 2017-11-30 MED FILL — ATORVASTATIN 80 MG TABLET: 80 | 90 days supply | Qty: 90 | Fill #0

## 2017-12-14 ENCOUNTER — Other Ambulatory Visit: Payer: Self-pay | Admitting: Family Medicine

## 2017-12-14 DIAGNOSIS — Z1231 Encounter for screening mammogram for malignant neoplasm of breast: Secondary | ICD-10-CM

## 2018-01-12 ENCOUNTER — Ambulatory Visit
Admission: RE | Admit: 2018-01-12 | Discharge: 2018-01-12 | Disposition: A | Discharge status: Home / Self Care | Payer: 59 | Source: Ambulatory Visit | Attending: Family Medicine | Admitting: Family Medicine

## 2018-01-12 DIAGNOSIS — Z1231 Encounter for screening mammogram for malignant neoplasm of breast: Secondary | ICD-10-CM | POA: Diagnosis not present

## 2018-04-22 DIAGNOSIS — E7800 Pure hypercholesterolemia, unspecified: Secondary | ICD-10-CM | POA: Diagnosis not present

## 2018-04-28 ENCOUNTER — Other Ambulatory Visit: Payer: Self-pay | Admitting: Family Medicine

## 2018-04-28 DIAGNOSIS — R945 Abnormal results of liver function studies: Principal | ICD-10-CM

## 2018-04-28 DIAGNOSIS — R7989 Other specified abnormal findings of blood chemistry: Secondary | ICD-10-CM

## 2018-05-11 ENCOUNTER — Other Ambulatory Visit: Payer: 59

## 2018-05-13 ENCOUNTER — Ambulatory Visit
Admission: RE | Admit: 2018-05-13 | Discharge: 2018-05-13 | Disposition: A | Discharge status: Home / Self Care | Payer: 59 | Source: Ambulatory Visit | Attending: Family Medicine | Admitting: Family Medicine

## 2018-05-13 DIAGNOSIS — R7989 Other specified abnormal findings of blood chemistry: Secondary | ICD-10-CM

## 2018-05-13 DIAGNOSIS — K760 Fatty (change of) liver, not elsewhere classified: Secondary | ICD-10-CM | POA: Diagnosis not present

## 2018-05-13 DIAGNOSIS — R945 Abnormal results of liver function studies: Principal | ICD-10-CM

## 2018-07-13 MED FILL — ATORVASTATIN 80 MG TABLET: 80 | 90 days supply | Qty: 90 | Fill #0

## 2018-07-13 MED FILL — METOPROLOL TARTRATE 25 MG T: 25 | 90 days supply | Qty: 180 | Fill #0

## 2018-07-28 DIAGNOSIS — R945 Abnormal results of liver function studies: Secondary | ICD-10-CM | POA: Diagnosis not present

## 2018-10-19 DIAGNOSIS — N183 Chronic kidney disease, stage 3 (moderate): Secondary | ICD-10-CM | POA: Diagnosis not present

## 2018-10-19 DIAGNOSIS — I129 Hypertensive chronic kidney disease with stage 1 through stage 4 chronic kidney disease, or unspecified chronic kidney disease: Secondary | ICD-10-CM | POA: Diagnosis not present

## 2018-10-19 DIAGNOSIS — R079 Chest pain, unspecified: Secondary | ICD-10-CM | POA: Diagnosis not present

## 2018-10-19 DIAGNOSIS — I471 Supraventricular tachycardia: Secondary | ICD-10-CM | POA: Diagnosis not present

## 2018-10-19 DIAGNOSIS — R945 Abnormal results of liver function studies: Secondary | ICD-10-CM | POA: Diagnosis not present

## 2018-10-19 DIAGNOSIS — E785 Hyperlipidemia, unspecified: Secondary | ICD-10-CM | POA: Diagnosis not present

## 2018-10-19 DIAGNOSIS — Z0000 Encounter for general adult medical examination without abnormal findings: Secondary | ICD-10-CM | POA: Diagnosis not present

## 2018-10-26 DIAGNOSIS — D500 Iron deficiency anemia secondary to blood loss (chronic): Secondary | ICD-10-CM | POA: Diagnosis not present

## 2018-10-28 MED FILL — POLY-IRON 150 MG CAPSULE: 150 | 90 days supply | Qty: 90 | Fill #0

## 2018-11-13 ENCOUNTER — Encounter: Payer: Self-pay | Admitting: Cardiology

## 2018-11-13 DIAGNOSIS — R079 Chest pain, unspecified: Secondary | ICD-10-CM | POA: Insufficient documentation

## 2018-11-13 NOTE — Progress Notes (Deleted)
Cardiology Office Note    Date:  11/13/2018   ID:  Traci Brown, DOB 11-01-1955, MRN [id number]  PCP:  Patient, No Pcp Per  Cardiologist:  Armanda Magic, MD   No chief complaint on file.   History of Present Illness:  Traci Brown is a 63 y.o. female who is being seen today for the evaluation of pain at the request of Clovis Riley, L.August Saucer, MD.  Is a pleasant 63 year old female with a history of CKD stage III anemia, hyperlipidemia, hypertension PSVT who was referred now for evaluation of chest pain by her PCP.  She does not smoke and has no family hx of CAD.    Past Medical History:  Diagnosis Date  . Abdominal pain, epigastric 06/18/2015  . Anemia 05/02/2014  . CKD (chronic kidney disease) stage 3, GFR 30-59 ml/min (HCC) 07/08/2012  . Constipation   . Elevated LFTs   . HLD (hyperlipidemia) 12/15/2011  . Hypernatremia 07/22/2012  . Hypertension 07/08/2012  . Leg pain 04/15/2011   Pt has been having cramps in the thigh region for the last 3-4 weeks. She has tried vimovo without full relief.    . Leukopenia 10/16/2014  . Paroxysmal supraventricular tachycardia (HCC) 06/18/2015  . Rhabdomyolysis 10/09/2014  . Thoracic spine pain 03/01/2014  . Vaginal atrophy     Past Surgical History:  Procedure Laterality Date  . BACK SURGERY    . CESAREAN SECTION  2010  . CHOLECYSTECTOMY    . LACERATION REPAIR Right 2010   ON HANDS AND FACE  . womb tied (in [place] exact procedure unknown)      Current Medications: No outpatient medications have been marked as taking for the 11/14/18 encounter (Appointment) with Quintella Reichert, MD.   Current Facility-Administered Medications for the 11/14/18 encounter (Appointment) with Quintella Reichert, MD  Medication  . betamethasone acetate-betamethasone sodium phosphate (CELESTONE) injection 3 mg    Allergies:   Patient has no known allergies.   Social History   Socioeconomic History  . Marital status: Single    Spouse name: Not on file  .  Number of children: Not on file  . Years of education: Not on file  . Highest education level: Not on file  Occupational History  . Occupation: Oncologist: [employer]  Social Needs  . Financial resource strain: Not on file  . Food insecurity:    Worry: Not on file    Inability: Not on file  . Transportation needs:    Medical: Not on file    Non-medical: Not on file  Tobacco Use  . Smoking status: Never Smoker  . Smokeless tobacco: Never Used  Substance and Sexual Activity  . Alcohol use: No  . Drug use: No  . Sexual activity: Yes  Lifestyle  . Physical activity:    Days per week: Not on file    Minutes per session: Not on file  . Stress: Not on file  Relationships  . Social connections:    Talks on phone: Not on file    Gets together: Not on file    Attends religious service: Not on file    Active member of club or organization: Not on file    Attends meetings of clubs or organizations: Not on file    Relationship status: Not on file  Other Topics Concern  . Not on file  Social History Narrative  . Not on file     Family History:  The patient's family history  includes Hypertension in her mother; Other in her father and mother.   ROS:   Please see the history of present illness.    ROS All other systems reviewed and are negative.  No flowsheet data found.     PHYSICAL EXAM:   VS:  There were no vitals taken for this visit.   GEN: Well nourished, well developed, in no acute distress  HEENT: normal  Neck: no JVD, carotid bruits, or masses Cardiac: RRR; no murmurs, rubs, or gallops,no edema.  Intact distal pulses bilaterally.  Respiratory:  clear to auscultation bilaterally, normal work of breathing GI: soft, nontender, nondistended, + BS MS: no deformity or atrophy  Skin: warm and dry, no rash Neuro:  Alert and Oriented x 3, Strength and sensation are intact Psych: euthymic mood, full affect  Wt Readings from Last 3 Encounters:  08/27/15  204 lb 14.4 oz (92.9 kg)  06/27/15 207 lb 12.8 oz (94.3 kg)  06/18/15 209 lb 1.6 oz (94.8 kg)      Studies/Labs Reviewed:   EKG:  EKG is ordered today.  The ekg ordered today demonstrates ***  Recent Labs: No results found for requested labs within last 8760 hours.   Lipid Panel    Component Value Date/Time   CHOL 422 (H) 06/18/2015 1601   TRIG 438 (H) 06/18/2015 1601   HDL 32 (L) 06/18/2015 1601   CHOLHDL 13.2 (H) 06/18/2015 1601   CHOLHDL 11.9 10/09/2014 1630   VLDL NOT CALC 10/09/2014 1630   LDLCALC Comment 06/18/2015 1601   LDLDIRECT 187 (H) 10/11/2014 0240    Additional studies/ records that were reviewed today include:  Office notes by PCP    ASSESSMENT:    1. Chest pain, unspecified type   2. Essential hypertension   3. Paroxysmal supraventricular tachycardia (HCC)      PLAN:  In order of problems listed above:  1.  Chest pain - Her cardiac risk factors include HTN and hyperlipidemia.   2.  HTN - BP is controlled on exam today.  She will continue on Lopressor 25mg  BID.  3.  PSVT - She denies any palpitations on BB.     Medication Adjustments/Labs and Tests Ordered: Current medicines are reviewed at length with the patient today.  Concerns regarding medicines are outlined above.  Medication changes, Labs and Tests ordered today are listed in the Patient Instructions below.  There are no Patient Instructions on file for this visit.   Signed, Armanda Magic, MD  11/13/2018 6:37 PM    [facility] [address] [place], [place], [place]  [address] Phone: [phone]; Fax: [phone]

## 2018-11-14 ENCOUNTER — Ambulatory Visit: Payer: 59 | Admitting: Cardiology

## 2018-11-15 ENCOUNTER — Encounter: Payer: Self-pay | Admitting: Cardiology

## 2018-11-28 DIAGNOSIS — Z01419 Encounter for gynecological examination (general) (routine) without abnormal findings: Secondary | ICD-10-CM | POA: Diagnosis not present

## 2018-12-12 ENCOUNTER — Other Ambulatory Visit: Payer: Self-pay | Admitting: Family Medicine

## 2018-12-12 DIAGNOSIS — Z1231 Encounter for screening mammogram for malignant neoplasm of breast: Secondary | ICD-10-CM

## 2019-02-13 ENCOUNTER — Ambulatory Visit
Admission: RE | Admit: 2019-02-13 | Discharge: 2019-02-13 | Disposition: A | Discharge status: Home / Self Care | Payer: 59 | Source: Ambulatory Visit | Attending: Family Medicine | Admitting: Family Medicine

## 2019-02-13 ENCOUNTER — Other Ambulatory Visit: Payer: Self-pay

## 2019-02-13 DIAGNOSIS — Z1231 Encounter for screening mammogram for malignant neoplasm of breast: Secondary | ICD-10-CM

## 2019-05-17 DIAGNOSIS — D500 Iron deficiency anemia secondary to blood loss (chronic): Secondary | ICD-10-CM | POA: Diagnosis not present

## 2019-05-31 DIAGNOSIS — D509 Iron deficiency anemia, unspecified: Secondary | ICD-10-CM | POA: Diagnosis not present

## 2019-05-31 DIAGNOSIS — N183 Chronic kidney disease, stage 3 (moderate): Secondary | ICD-10-CM | POA: Diagnosis not present

## 2019-05-31 MED FILL — SUPREP BOWEL PREP KIT: 17.5-3.13-1 | 1 days supply | Qty: 354 | Fill #0

## 2019-06-02 MED FILL — POLY-IRON 150 MG CAPSULE: 150 | 90 days supply | Qty: 90 | Fill #1

## 2019-06-20 DIAGNOSIS — K319 Disease of stomach and duodenum, unspecified: Secondary | ICD-10-CM | POA: Diagnosis not present

## 2019-06-20 DIAGNOSIS — D509 Iron deficiency anemia, unspecified: Secondary | ICD-10-CM | POA: Diagnosis not present

## 2019-06-20 DIAGNOSIS — K5730 Diverticulosis of large intestine without perforation or abscess without bleeding: Secondary | ICD-10-CM | POA: Diagnosis not present

## 2019-06-20 DIAGNOSIS — K259 Gastric ulcer, unspecified as acute or chronic, without hemorrhage or perforation: Secondary | ICD-10-CM | POA: Diagnosis not present

## 2019-06-20 MED FILL — OMEPRAZOLE DR 40 MG CAPSULE: 40 | 30 days supply | Qty: 30 | Fill #0

## 2019-06-26 MED FILL — CYCLOBENZAPRINE 10 MG TAB: 10 | 30 days supply | Qty: 30 | Fill #0

## 2019-06-26 MED FILL — predniSONE 10 MG TABS: 10 | 6 days supply | Qty: 21 | Fill #0

## 2019-08-22 ENCOUNTER — Encounter (HOSPITAL_COMMUNITY): Payer: Self-pay

## 2019-08-22 ENCOUNTER — Other Ambulatory Visit: Payer: Self-pay

## 2019-08-22 ENCOUNTER — Emergency Department (HOSPITAL_COMMUNITY)
Admission: EM | Admit: 2019-08-22 | Discharge: 2019-08-22 | Disposition: A | Discharge status: Home / Self Care | Payer: 59 | Attending: Emergency Medicine | Admitting: Emergency Medicine

## 2019-08-22 DIAGNOSIS — Z79899 Other long term (current) drug therapy: Secondary | ICD-10-CM | POA: Insufficient documentation

## 2019-08-22 DIAGNOSIS — R55 Syncope and collapse: Secondary | ICD-10-CM | POA: Insufficient documentation

## 2019-08-22 DIAGNOSIS — Z7982 Long term (current) use of aspirin: Secondary | ICD-10-CM | POA: Insufficient documentation

## 2019-08-22 DIAGNOSIS — R000 Tachycardia, unspecified: Secondary | ICD-10-CM | POA: Diagnosis not present

## 2019-08-22 DIAGNOSIS — I129 Hypertensive chronic kidney disease with stage 1 through stage 4 chronic kidney disease, or unspecified chronic kidney disease: Secondary | ICD-10-CM | POA: Insufficient documentation

## 2019-08-22 DIAGNOSIS — N183 Chronic kidney disease, stage 3 unspecified: Secondary | ICD-10-CM | POA: Insufficient documentation

## 2019-08-22 LAB — BASIC METABOLIC PANEL
Anion gap: 11 (ref 5–15)
BUN: 14 mg/dL (ref 8–23)
CO2: 25 mmol/L (ref 22–32)
Calcium: 9.3 mg/dL (ref 8.9–10.3)
Chloride: 103 mmol/L (ref 98–111)
Creatinine, Ser: 1.28 mg/dL — ABNORMAL HIGH (ref 0.44–1.00)
GFR calc Af Amer: 52 mL/min — ABNORMAL LOW (ref >60)
GFR calc non Af Amer: 45 mL/min — ABNORMAL LOW (ref >60)
Glucose, Bld: 93 mg/dL (ref 70–99)
Potassium: 5.1 mmol/L (ref 3.5–5.1)
Sodium: 139 mmol/L (ref 135–145)

## 2019-08-22 LAB — CBC WITH DIFFERENTIAL/PLATELET
Abs Immature Granulocytes: 0 10*3/uL (ref 0.00–0.07)
Basophils Absolute: 0 10*3/uL (ref 0.0–0.1)
Basophils Relative: 1 %
Eosinophils Absolute: 0.2 10*3/uL (ref 0.0–0.5)
Eosinophils Relative: 6 %
HCT: 32.8 % — ABNORMAL LOW (ref 36.0–46.0)
Hemoglobin: 10.2 g/dL — ABNORMAL LOW (ref 12.0–15.0)
Immature Granulocytes: 0 %
Lymphocytes Relative: 38 %
Lymphs Abs: 1.1 10*3/uL (ref 0.7–4.0)
MCH: 27.1 pg (ref 26.0–34.0)
MCHC: 31.1 g/dL (ref 30.0–36.0)
MCV: 87 fL (ref 80.0–100.0)
Monocytes Absolute: 0.2 10*3/uL (ref 0.1–1.0)
Monocytes Relative: 7 %
Neutro Abs: 1.3 10*3/uL — ABNORMAL LOW (ref 1.7–7.7)
Neutrophils Relative %: 48 %
Platelets: 230 10*3/uL (ref 150–400)
RBC: 3.77 MIL/uL — ABNORMAL LOW (ref 3.87–5.11)
RDW: 15.3 % (ref 11.5–15.5)
WBC: 2.8 10*3/uL — ABNORMAL LOW (ref 4.0–10.5)
nRBC: 0 % (ref 0.0–0.2)

## 2019-08-22 LAB — MAGNESIUM: Magnesium: 2 mg/dL (ref 1.7–2.4)

## 2019-08-22 LAB — TSH: TSH: 5.217 u[IU]/mL — ABNORMAL HIGH (ref 0.350–4.500)

## 2019-08-22 MED ORDER — SODIUM CHLORIDE 0.9 % IV BOLUS
1000.0000 mL | Freq: Once | INTRAVENOUS | Status: AC
  Administered 2019-08-22: 13:00:00 1000 mL via INTRAVENOUS

## 2019-08-22 NOTE — ED Provider Notes (Signed)
[facility] Provider Note   CSN: [id number] Arrival date & time: 08/22/19  1140     History Chief Complaint  Patient presents with  . Tachycardia    Traci Brown is a 63 y.o. female.  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  She was assisted to the nursing station where they found her hypotensive with a blood pressure in the 90s and tachycardic to 1 50-1 60.  There was no loss of consciousness.  She denies any chest pain.  She said this is happened to her in a year or 2 ago when she had a follow-up with her cardiologist.  She said she is taking her medications regular.  No fevers chills cough vomiting diarrhea.  Has been eating and drinking well.  The history is provided by the patient.  Weakness Severity:  Severe Onset quality:  Sudden Duration:  30 minutes Timing:  Constant Progression:  Unchanged Chronicity:  Recurrent Relieved by:  None tried Worsened by:  Activity Ineffective treatments:  None tried Associated symptoms: dizziness and near-syncope   Associated symptoms: no abdominal pain, no aphasia, no chest pain, no cough, no diarrhea, no dysuria, no numbness in extremities, no falls, no fever, no foul-smelling urine, no shortness of breath and no vomiting        Past Medical History:  Diagnosis Date  . Abdominal pain, epigastric 06/18/2015  . Anemia 05/02/2014  . CKD (chronic kidney disease) stage 3, GFR 30-59 ml/min (HCC) 07/08/2012  . Constipation   . Elevated LFTs   . HLD (hyperlipidemia) 12/15/2011  . Hypernatremia 07/22/2012  . Hypertension 07/08/2012  . Leg pain 04/15/2011   Pt has been having cramps in the thigh region for the last 3-4 weeks. She has tried vimovo without full relief.    . Leukopenia 10/16/2014  . Paroxysmal supraventricular tachycardia (HCC) 06/18/2015  . Rhabdomyolysis 10/09/2014  . Thoracic spine pain 03/01/2014   . Vaginal atrophy     Patient Active Problem List   Diagnosis Date Noted  . Chest pain 11/13/2018  . Preventative health care 06/18/2015  . Paroxysmal supraventricular tachycardia (HCC) 06/18/2015  . Abdominal pain, epigastric 06/18/2015  . Leukopenia 10/16/2014  . Rhabdomyolysis 10/09/2014  . Anemia 05/02/2014  . Thoracic spine pain 03/01/2014  . Hypertension 07/08/2012  . CKD (chronic kidney disease) stage 3, GFR 30-59 ml/min 07/08/2012  . HLD (hyperlipidemia) 12/15/2011  . Leg pain 04/15/2011    Past Surgical History:  Procedure Laterality Date  . BACK SURGERY    . CESAREAN SECTION  2010  . CHOLECYSTECTOMY    . LACERATION REPAIR Right 2010   ON HANDS AND FACE  . womb [place]       OB History   No obstetric history on file.     Family History  Problem Relation Age of Onset  . Hypertension Mother   . Other Mother        [place] Other [place]  . Heart disease Neg Hx     Social History   Tobacco Use  . Smoking status: Never Smoker  . Smokeless tobacco: Never Used  Substance Use Topics  . Alcohol use: No  . Drug use: No    Home Medications Prior to Admission medications   Medication Sig Start Date End  Date Taking? Authorizing Provider  aspirin EC 81 MG tablet Take 81 mg by mouth daily.    [provider]  atorvastatin (LIPITOR) 20 MG tablet  04/01/16   [provider]  cyclobenzaprine (FLEXERIL) 10 MG tablet Take 1 tablet (10 mg total) by mouth 3 (three) times daily as needed for muscle spasms. 08/27/15   Emokpae, Ejiroghene E, MD  gemfibrozil (LOPID) 600 MG tablet Take 1 tablet (600 mg total) by mouth 2 (two) times daily before a meal. 08/27/15   Emokpae, Ejiroghene E, MD  Green Tea, Camillia sinensis, (GREEN TEA PO) Take 1 tablet by mouth daily.    [provider]  metoprolol tartrate (LOPRESSOR) 25 MG tablet Take 1 tablet (25 mg total) 2 (two) times  daily by mouth. 07/23/17   Joy, Shawn C, PA-C  NONFORMULARY OR COMPOUNDED ITEM Apply 1-2 g topically 4 (four) times daily. 07/08/16   Edrick Kins, DPM  omeprazole (PRILOSEC) 20 MG capsule Take 1 capsule (20 mg total) by mouth daily. 08/27/15   Emokpae, Ejiroghene E, MD  potassium chloride (K-DUR) 10 MEQ tablet Take 1 tablet (10 mEq total) 2 (two) times daily for 5 days by mouth. 07/23/17 07/28/17  Joy, Shawn C, PA-C    Allergies    Patient has no known allergies.  Review of Systems   Review of Systems  Constitutional: Negative for fever.  HENT: Negative for sore throat.   Eyes: Negative for visual disturbance.  Respiratory: Negative for cough and shortness of breath.   Cardiovascular: Positive for near-syncope. Negative for chest pain.  Gastrointestinal: Negative for abdominal pain, diarrhea and vomiting.  Genitourinary: Negative for dysuria.  Musculoskeletal: Negative for falls and neck pain.  Skin: Negative for rash.  Neurological: Positive for dizziness and weakness.    Physical Exam Updated Vital Signs BP 112/61   Pulse 83   Temp 98.1 F (36.7 C) (Oral)   Resp 15   Ht 5\' 6"  (1.676 m)   Wt 90.7 kg   SpO2 100%   BMI 32.28 kg/m   Physical Exam Vitals and nursing note reviewed.  Constitutional:      General: She is not in acute distress.    Appearance: She is well-developed.  HENT:     Head: Normocephalic and atraumatic.  Eyes:     Conjunctiva/sclera: Conjunctivae normal.  Cardiovascular:     Rate and Rhythm: Regular rhythm. Tachycardia present.     Heart sounds: No murmur.  Pulmonary:     Effort: Pulmonary effort is normal. No respiratory distress.     Breath sounds: Normal breath sounds.  Abdominal:     Palpations: Abdomen is soft.     Tenderness: There is no abdominal tenderness.  Musculoskeletal:        General: Normal range of motion.     Cervical back: Neck supple.     Right lower leg: No edema.     Left lower leg: No edema.  Skin:    General: Skin  is warm and dry.     Capillary Refill: Capillary refill takes less than 2 seconds.  Neurological:     General: No focal deficit present.     Mental Status: She is alert.     ED Results / Procedures / Treatments   Labs (all labs ordered are listed, but only abnormal results are displayed) Labs Reviewed  BASIC METABOLIC PANEL - Abnormal; Notable for the following components:      Result Value   Creatinine, Ser 1.28 (*)  GFR calc non Af Amer 45 (*)    GFR calc Af Amer 52 (*)    All other components within normal limits  CBC WITH DIFFERENTIAL/PLATELET - Abnormal; Notable for the following components:   WBC 2.8 (*)    RBC 3.77 (*)    Hemoglobin 10.2 (*)    HCT 32.8 (*)    Neutro Abs 1.3 (*)    All other components within normal limits  TSH - Abnormal; Notable for the following components:   TSH 5.217 (*)    All other components within normal limits  MAGNESIUM    EKG EKG Interpretation  Date/Time:  Tuesday August 22 2019 13:03:06 EST Ventricular Rate:  82 PR Interval:    QRS Duration: 114 QT Interval:  414 QTC Calculation: 484 R Axis:   14 Text Interpretation: Sinus rhythm Prolonged PR interval Borderline intraventricular conduction delay return to sinus compared with prior today Confirmed by Meridee Score [phone]) on 08/22/2019 1:06:34 PM   Radiology No results found.  Procedures Procedures (including critical care time)  Medications Ordered in ED Medications  sodium chloride 0.9 % bolus 1,000 mL (0 mLs Intravenous Stopped 08/22/19 1345)    ED Course  I have reviewed the triage vital signs and the nursing notes.  Pertinent labs & imaging results that were available during my care of the patient were reviewed by me and considered in my medical decision making (see chart for details).  Clinical Course as of Aug 22 1711  Tue Aug 22, 2019  6034 63 year old female here with feeling lightheaded and some throat tightness.  Sounds like she is very tachycardic and  hypotensive.  Here she still tachycardic but improved to the 130s and her blood pressure is improved lying down.  Differential includes arrhythmia, dehydration, hypovolemia, less likely sepsis, PE   [MB]  1256 Try to vagal the patient to see if she broke into a different rhythm with no success.  Back on the monitor now and the patient's heart rate is in the 80s.  We will get another EKG.   [MB]  1331 Patient's hemoglobin and white blood cell count low although around baseline for her.   [MB]  1331 Repeat EKG shows her back in sinus with a rate of 82.   [MB]  1427 Creatinine slightly elevated but at baseline.  TSH high but similar to 4 years ago.   [MB]    Clinical Course User Index [MB] Terrilee Files, MD   MDM Rules/Calculators/A&P                      Final Clinical Impression(s) / ED Diagnoses Final diagnoses:  Tachycardia  Near syncope    Rx / DC Orders ED Discharge Orders    None       Terrilee Files, MD 08/22/19 1712

## 2019-08-22 NOTE — ED Triage Notes (Signed)
Pt felt heart going too fast. Denies loc. Pt team members brought her to the ED.

## 2019-08-22 NOTE — ED Notes (Signed)
No answer x 1 for triage

## 2019-08-22 NOTE — ED Notes (Signed)
Pt ambulated to the RR.  

## 2019-08-22 NOTE — Discharge Instructions (Signed)
You were seen in the emergency department for an elevation of lightheadedness and feeling like you might pass out.  Your heart rate was elevated and your blood pressure was low at that time.  Your symptoms improved and your vital signs returned to normal in the emergency department.  You possibly were back in SVT which you have had before.  Your thyroid test was also slightly abnormal and will need to be followed up with your primary care doctor.  Please continue your regular medications and return to the emergency department if any worsening symptoms.

## 2019-10-05 DIAGNOSIS — Z7189 Other specified counseling: Secondary | ICD-10-CM | POA: Insufficient documentation

## 2019-10-05 NOTE — Progress Notes (Deleted)
Cardiology Office Note   Date:  10/05/2019   ID:  Traci Brown, Traci Brown 1956-01-27, MRN [id number]  PCP:  Patient, No Pcp Per  Cardiologist:   No primary care provider on file. Referring:  ***  No chief complaint on file.     History of Present Illness: Traci Brown is a 64 y.o. female who presents for ***    EF in 2018 demonstrated a normal EF.  POET (Plain Old Exercise Treadmill) in 2016 demonstrated no ischemia.   She was seen for evaluation PSVT.  ***    Past Medical History:  Diagnosis Date  . Abdominal pain, epigastric 06/18/2015  . Anemia 05/02/2014  . CKD (chronic kidney disease) stage 3, GFR 30-59 ml/min 07/08/2012  . Constipation   . Elevated LFTs   . HLD (hyperlipidemia) 12/15/2011  . Hypernatremia 07/22/2012  . Hypertension 07/08/2012  . Leg pain 04/15/2011   Pt has been having cramps in the thigh region for the last 3-4 weeks. She has tried vimovo without full relief.    . Leukopenia 10/16/2014  . Paroxysmal supraventricular tachycardia (HCC) 06/18/2015  . Rhabdomyolysis 10/09/2014  . Thoracic spine pain 03/01/2014  . Vaginal atrophy     Past Surgical History:  Procedure Laterality Date  . BACK SURGERY    . CESAREAN SECTION  2010  . CHOLECYSTECTOMY    . LACERATION REPAIR Right 2010   ON HANDS AND FACE  . womb tied [place]in [place] exact procedure unknown)       Current Outpatient Medications  Medication Sig Dispense Refill  . aspirin EC 81 MG tablet Take 81 mg by mouth daily.    Marland Kitchen atorvastatin (LIPITOR) 20 MG tablet Take 20 mg by mouth daily at 6 PM.   11  . cyclobenzaprine (FLEXERIL) 10 MG tablet Take 1 tablet (10 mg total) by mouth 3 (three) times daily as needed for muscle spasms. 15 tablet 0  . gemfibrozil (LOPID) 600 MG tablet Take 1 tablet (600 mg total) by mouth 2 (two) times daily before a meal. 60 tablet 2  . Green Tea, Camillia sinensis, (GREEN TEA PO) Take 1 tablet by mouth daily.    . metoprolol tartrate (LOPRESSOR) 25 MG tablet Take 1  tablet (25 mg total) 2 (two) times daily by mouth. 60 tablet 2  . NONFORMULARY OR COMPOUNDED ITEM Apply 1-2 g topically 4 (four) times daily. 120 each 2  . omeprazole (PRILOSEC) 20 MG capsule Take 1 capsule (20 mg total) by mouth daily. (Patient not taking: Reported on 08/22/2019) 30 capsule 3  . potassium chloride (K-DUR) 10 MEQ tablet Take 1 tablet (10 mEq total) 2 (two) times daily for 5 days by mouth. (Patient not taking: Reported on 08/22/2019) 10 tablet 0   Current Facility-Administered Medications  Medication Dose Route Frequency Provider Last Rate Last Admin  . betamethasone acetate-betamethasone sodium phosphate (CELESTONE) injection 3 mg  3 mg Intramuscular Once Felecia Shelling, DPM        Allergies:   Patient has no known allergies.    Social History:  The patient  reports that she has never smoked. She has never used smokeless tobacco. She reports that she does not drink alcohol or use drugs.   Family History:  The patient's ***family history includes Hypertension in her mother; Other in her father and mother.    ROS:  Please see the history of present illness.   Otherwise, review of systems are positive for {NONE DEFAULTED:18576::"none"}.   All other systems are  reviewed and negative.    PHYSICAL EXAM: VS:  There were no vitals taken for this visit. , BMI There is no height or weight on file to calculate BMI. GENERAL:  Well appearing HEENT:  Pupils equal round and reactive, fundi not visualized, oral mucosa unremarkable NECK:  No jugular venous distention, waveform within normal limits, carotid upstroke brisk and symmetric, no bruits, no thyromegaly LYMPHATICS:  No cervical, inguinal adenopathy LUNGS:  Clear to auscultation bilaterally BACK:  No CVA tenderness CHEST:  Unremarkable HEART:  PMI not displaced or sustained,S1 and S2 within normal limits, no S3, no S4, no clicks, no rubs, *** murmurs ABD:  Flat, positive bowel sounds normal in frequency in pitch, no bruits, no  rebound, no guarding, no midline pulsatile mass, no hepatomegaly, no splenomegaly EXT:  2 plus pulses throughout, no edema, no cyanosis no clubbing SKIN:  No rashes no nodules NEURO:  Cranial nerves II through XII grossly intact, motor grossly intact throughout PSYCH:  Cognitively intact, oriented to person place and time    EKG:  EKG {ACTION; IS/IS [id number]} ordered today. The ekg ordered today demonstrates ***   Recent Labs: 08/22/2019: BUN 14; Creatinine, Ser 1.28; Hemoglobin 10.2; Magnesium 2.0; Platelets 230; Potassium 5.1; Sodium 139; TSH 5.217    Lipid Panel    Component Value Date/Time   CHOL 422 (H) 06/18/2015 1601   TRIG 438 (H) 06/18/2015 1601   HDL 32 (L) 06/18/2015 1601   CHOLHDL 13.2 (H) 06/18/2015 1601   CHOLHDL 11.9 10/09/2014 1630   VLDL NOT CALC 10/09/2014 1630   LDLCALC Comment 06/18/2015 1601   LDLDIRECT 187 (H) 10/11/2014 0240      Wt Readings from Last 3 Encounters:  08/22/19 200 lb (90.7 kg)  08/27/15 204 lb 14.4 oz (92.9 kg)  06/27/15 207 lb 12.8 oz (94.3 kg)      Other studies Reviewed: Additional studies/ records that were reviewed today include: ***. Review of the above records demonstrates:  Please see elsewhere in the note.  ***   ASSESSMENT AND PLAN:  PSVT:  ***  CKD III:  ***  HTN:  ***  COVID EDUCATION:  ***  Current medicines are reviewed at length with the patient today.  The patient {ACTIONS; HAS/DOES NOT HAVE:19233} concerns regarding medicines.  The following changes have been made:  {PLAN; NO CHANGE:13088:s}  Labs/ tests ordered today include: *** No orders of the defined types were placed in this encounter.    Disposition:   FU with ***    Signed, Minus Breeding, MD  10/05/2019 3:49 PM    [facility]

## 2019-10-06 ENCOUNTER — Ambulatory Visit: Payer: 59 | Admitting: Cardiology

## 2019-10-09 ENCOUNTER — Emergency Department (HOSPITAL_COMMUNITY): Payer: 59

## 2019-10-09 ENCOUNTER — Encounter (HOSPITAL_COMMUNITY): Payer: Self-pay | Admitting: Emergency Medicine

## 2019-10-09 ENCOUNTER — Other Ambulatory Visit: Payer: Self-pay

## 2019-10-09 ENCOUNTER — Emergency Department (HOSPITAL_COMMUNITY)
Admission: EM | Admit: 2019-10-09 | Discharge: 2019-10-09 | Disposition: A | Discharge status: Home / Self Care | Payer: 59 | Attending: Emergency Medicine | Admitting: Emergency Medicine

## 2019-10-09 DIAGNOSIS — I129 Hypertensive chronic kidney disease with stage 1 through stage 4 chronic kidney disease, or unspecified chronic kidney disease: Secondary | ICD-10-CM | POA: Insufficient documentation

## 2019-10-09 DIAGNOSIS — Z79899 Other long term (current) drug therapy: Secondary | ICD-10-CM | POA: Insufficient documentation

## 2019-10-09 DIAGNOSIS — M25551 Pain in right hip: Secondary | ICD-10-CM | POA: Diagnosis not present

## 2019-10-09 DIAGNOSIS — W19XXXA Unspecified fall, initial encounter: Secondary | ICD-10-CM

## 2019-10-09 DIAGNOSIS — W001XXA Fall from stairs and steps due to ice and snow, initial encounter: Secondary | ICD-10-CM | POA: Diagnosis not present

## 2019-10-09 DIAGNOSIS — Y92008 Other place in unspecified non-institutional (private) residence as the place of occurrence of the external cause: Secondary | ICD-10-CM | POA: Insufficient documentation

## 2019-10-09 DIAGNOSIS — Z7982 Long term (current) use of aspirin: Secondary | ICD-10-CM | POA: Insufficient documentation

## 2019-10-09 DIAGNOSIS — Y9389 Activity, other specified: Secondary | ICD-10-CM | POA: Insufficient documentation

## 2019-10-09 DIAGNOSIS — Y999 Unspecified external cause status: Secondary | ICD-10-CM | POA: Insufficient documentation

## 2019-10-09 DIAGNOSIS — N183 Chronic kidney disease, stage 3 unspecified: Secondary | ICD-10-CM | POA: Insufficient documentation

## 2019-10-09 DIAGNOSIS — S79911A Unspecified injury of right hip, initial encounter: Secondary | ICD-10-CM | POA: Diagnosis not present

## 2019-10-09 MED ORDER — ACETAMINOPHEN 500 MG PO TABS: 500.0000 mg | ORAL_TABLET | Freq: Four times a day (QID) | ORAL | 0 refills | Status: DC | PRN

## 2019-10-09 MED ORDER — KETOROLAC TROMETHAMINE 30 MG/ML IJ SOLN
45.0000 mg | Freq: Once | INTRAMUSCULAR | Status: AC
  Administered 2019-10-09: 45 mg via INTRAMUSCULAR
  Filled 2019-10-09: qty 2

## 2019-10-09 MED ORDER — NAPROXEN 500 MG PO TABS: 500.0000 mg | ORAL_TABLET | Freq: Two times a day (BID) | ORAL | 0 refills | Status: DC | PRN

## 2019-10-09 MED FILL — NAPROXEN 500 MG TABLET: 500 | 15 days supply | Qty: 30 | Fill #0

## 2019-10-09 NOTE — ED Notes (Signed)
Patient refused crutches.

## 2019-10-09 NOTE — Progress Notes (Signed)
Orthopedic Tech Progress Note Patient Details:  Traci Brown May 05, 1956 [id number]  Patient ID: Traci Brown, female   DOB: 24-Jun-1956, 64 y.o.   MRN: [id number] I went to deliver crutches and do crutch training. The pt refused crutches and said she could walk. I informed the RN and told her to  call if things changed and the pt did end up needing the crutches.  [name] 10/09/2019, 8:07 AM

## 2019-10-09 NOTE — Discharge Instructions (Signed)
Please take your medications, as prescribed.  You will need to follow-up with your primary care provider or with Dr. Charlann Boxer, orthopedist with [facility], for ongoing evaluation and management of your right hip discomfort.  Please return to the ED or seek immediate medical attention for any new or worsening symptoms.

## 2019-10-09 NOTE — ED Provider Notes (Addendum)
[facility] EMERGENCY DEPARTMENT Provider Note   CSN: [id number] Arrival date & time: 10/09/19  1017     History Chief Complaint  Patient presents with  . Fall    Hip Pain Right    Traci Brown is a 64 y.o. female with no relevant PMH presents to the ED with right-sided hip pain after sustaining a mechanical fall this morning.  Patient reports that she was walking down her front steps when she slipped on ice and landed on her right side.  It is a 10 out of 10 pain.  She was able to ambulate from the parking lot, albeit with discomfort.  She adamantly denies any head injury, loss consciousness, neck discomfort, back pain, chest or abdominal discomfort, difficulty breathing, numbness, weakness, or other neurologic symptoms.    HPI     Past Medical History:  Diagnosis Date  . Abdominal pain, epigastric 06/18/2015  . Anemia 05/02/2014  . CKD (chronic kidney disease) stage 3, GFR 30-59 ml/min 07/08/2012  . Constipation   . Elevated LFTs   . HLD (hyperlipidemia) 12/15/2011  . Hypernatremia 07/22/2012  . Hypertension 07/08/2012  . Leg pain 04/15/2011   Pt has been having cramps in the thigh region for the last 3-4 weeks. She has tried vimovo without full relief.    . Leukopenia 10/16/2014  . Paroxysmal supraventricular tachycardia (HCC) 06/18/2015  . Rhabdomyolysis 10/09/2014  . Thoracic spine pain 03/01/2014  . Vaginal atrophy     Patient Active Problem List   Diagnosis Date Noted  . Educated about COVID-19 virus infection 10/05/2019  . Chest pain 11/13/2018  . Preventative health care 06/18/2015  . Paroxysmal supraventricular tachycardia (HCC) 06/18/2015  . Abdominal pain, epigastric 06/18/2015  . Leukopenia 10/16/2014  . Rhabdomyolysis 10/09/2014  . Anemia 05/02/2014  . Thoracic spine pain 03/01/2014  . Hypertension 07/08/2012  . CKD (chronic kidney disease) stage 3, GFR 30-59 ml/min 07/08/2012  . HLD (hyperlipidemia) 12/15/2011  . Leg pain 04/15/2011     Past Surgical History:  Procedure Laterality Date  . BACK SURGERY    . CESAREAN SECTION  2010  . CHOLECYSTECTOMY    . LACERATION REPAIR Right 2010   ON HANDS AND FACE  . womb tied (in [place] exact procedure unknown)       OB History   No obstetric history on file.     Family History  Problem Relation Age of Onset  . Hypertension Mother   . Other Mother        WAR VICTIM IN [place]  . Other Father        WAR VICTIM IN [place]  . Heart disease Neg Hx     Social History   Tobacco Use  . Smoking status: Never Smoker  . Smokeless tobacco: Never Used  Substance Use Topics  . Alcohol use: No  . Drug use: No    Home Medications Prior to Admission medications   Medication Sig Start Date End Date Taking? Authorizing Provider  acetaminophen (TYLENOL) 500 MG tablet Take 1 tablet (500 mg total) by mouth every 6 (six) hours as needed for moderate pain. 10/09/19   Lorelee New, PA-C  aspirin EC 81 MG tablet Take 81 mg by mouth daily.    [provider]  atorvastatin (LIPITOR) 20 MG tablet Take 20 mg by mouth daily at 6 PM.  04/01/16   [provider]  cyclobenzaprine (FLEXERIL) 10 MG tablet Take 1 tablet (10 mg total) by mouth 3 (three) times  daily as needed for muscle spasms. 08/27/15   Emokpae, Ejiroghene E, MD  gemfibrozil (LOPID) 600 MG tablet Take 1 tablet (600 mg total) by mouth 2 (two) times daily before a meal. 08/27/15   Emokpae, Ejiroghene E, MD  Konstantina Nachreiner Tea, Camillia sinensis, (Layana Konkel TEA PO) Take 1 tablet by mouth daily.    [provider]  metoprolol tartrate (LOPRESSOR) 25 MG tablet Take 1 tablet (25 mg total) 2 (two) times daily by mouth. 07/23/17   Joy, Shawn C, PA-C  naproxen (NAPROSYN) 500 MG tablet Take 1 tablet (500 mg total) by mouth 2 (two) times daily between meals as needed for moderate pain. 10/09/19   Lorelee New, PA-C  NONFORMULARY OR COMPOUNDED ITEM Apply 1-2 g topically 4 (four) times daily. 07/08/16   Felecia Shelling, DPM   omeprazole (PRILOSEC) 20 MG capsule Take 1 capsule (20 mg total) by mouth daily. Patient not taking: Reported on 08/22/2019 08/27/15   Onnie Boer, MD  potassium chloride (K-DUR) 10 MEQ tablet Take 1 tablet (10 mEq total) 2 (two) times daily for 5 days by mouth. Patient not taking: Reported on 08/22/2019 07/23/17 07/28/17  Anselm Pancoast, PA-C    Allergies    Patient has no known allergies.  Review of Systems   Review of Systems  Musculoskeletal: Positive for arthralgias and gait problem.  Skin: Negative for wound.  Neurological: Negative for weakness and numbness.    Physical Exam Updated Vital Signs BP (!) 158/82 (BP Location: Right Arm)   Pulse 79   Temp 98 F (36.7 C) (Oral)   Resp 17   SpO2 100%   Physical Exam Vitals and nursing note reviewed. Exam conducted with a chaperone present.  Constitutional:      Appearance: Normal appearance.  HENT:     Head: Normocephalic and atraumatic.     Comments: No evidence of trauma. Eyes:     General: No scleral icterus.    Conjunctiva/sclera: Conjunctivae normal.  Cardiovascular:     Rate and Rhythm: Normal rate and regular rhythm.     Pulses: Normal pulses.     Heart sounds: Normal heart sounds.  Pulmonary:     Effort: Pulmonary effort is normal.  Musculoskeletal:     Cervical back: Normal range of motion and neck supple. No rigidity.     Comments: No cervical, thoracic, lumbar, or sacral midline tenderness to palpation.  No ecchymoses or other overlying skin changes.  Significant TTP over right-sided iliac crest.   Right hip: Passive ROM intact.  Discomfort with flexion. Right knee: ROM and strength fully intact. Right ankle: ROM and strength fully intact.  Sensation and pulses intact distally.   Skin:    General: Skin is dry.  Neurological:     Mental Status: She is alert.     GCS: GCS eye subscore is 4. GCS verbal subscore is 5. GCS motor subscore is 6.  Psychiatric:        Mood and Affect: Mood normal.         Behavior: Behavior normal.        Thought Content: Thought content normal.     ED Results / Procedures / Treatments   Labs (all labs ordered are listed, but only abnormal results are displayed) Labs Reviewed - No data to display  EKG None  Radiology DG Hip Unilat W or Wo Pelvis 2-3 Views Right  Result Date: 10/09/2019 CLINICAL DATA:  Slipped and fell this morning at home. Right hip pain. EXAM: DG  HIP (WITH OR WITHOUT PELVIS) 2-3V RIGHT COMPARISON:  None. FINDINGS: Both hips are normally located. No acute hip fracture. The pubic symphysis and SI joints are intact. No pelvic fractures or bone lesions. IMPRESSION: No acute bony findings. Electronically Signed   By: Marijo Sanes M.D.   On: 10/09/2019 07:05    Procedures Procedures (including critical care time)  Medications Ordered in ED Medications  ketorolac (TORADOL) 30 MG/ML injection 45 mg (45 mg Intramuscular Given 10/09/19 0804)    ED Course  I have reviewed the triage vital signs and the nursing notes.  Pertinent labs & imaging results that were available during my care of the patient were reviewed by me and considered in my medical decision making (see chart for details).  Clinical Course as of Oct 08 814  Mon Feb 01, 669  3669 64 year old female fell at home going down the steps slipping on an icy stair.  Complaining of severe pain to the right buttocks.  No loss of consciousness.  Ambulatory after fall.  No gross deformity on exam.  X-ray negative.  Pain control and likely discharged home.   [MB]    Clinical Course User Index [MB] Hayden Rasmussen, MD   MDM Rules/Calculators/A&P                      Plain films obtained of right hip/pelvis demonstrate no evidence of dislocation, fracture, or other acute abnormalities.  She remembers the event in its entirety and it was mechanical fall.  There is no precipitating seizure or syncopal/presyncopal episode.  She is not on blood thinners.  Patient reports that she  was able to ambulate from the parking lot to her walker here at [facility] without difficulty.  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  Reviewed patient's past medical history she did not appear to have any significant renal impairment on most recent BMP.  She denies any history of PUD.  Will provide patient with IM Toradol here in the ED and discharged home with naproxen and Tylenol.    On reexamination, patient is feeling improved after administration of Toradol IM.  Recommending outpatient orthopedic follow-up.  Patient declines crutches as she feels as though she can walk without difficulty.  Return precautions discussed. All of the evaluation and work-up results were discussed with the patient and any family at bedside. They were provided opportunity to ask any additional questions and have none at this time. They have expressed understanding of verbal discharge instructions as well as return precautions and are agreeable to the plan.    Final Clinical Impression(s) / ED Diagnoses Final diagnoses:  Fall, initial encounter    Rx / DC Orders ED Discharge Orders         Ordered    naproxen (NAPROSYN) 500 MG tablet  2 times daily between meals PRN     10/09/19 0738    acetaminophen (TYLENOL) 500 MG tablet  Every 6 hours PRN     10/09/19 0738           Corena Herter, PA-C 10/09/19 0816    Corena Herter, PA-C 10/09/19 0817    Hayden Rasmussen, MD 10/09/19 [phone]

## 2019-10-09 NOTE — ED Triage Notes (Signed)
Patient slipped and fell this morning at home , reports right upper hip pain worse with movement /changing positions .

## 2019-10-18 ENCOUNTER — Other Ambulatory Visit: Payer: Self-pay

## 2019-10-18 ENCOUNTER — Ambulatory Visit (INDEPENDENT_AMBULATORY_CARE_PROVIDER_SITE_OTHER): Payer: 59

## 2019-10-18 ENCOUNTER — Ambulatory Visit: Payer: 59 | Admitting: Orthopaedic Surgery

## 2019-10-18 ENCOUNTER — Encounter: Payer: Self-pay | Admitting: Orthopaedic Surgery

## 2019-10-18 DIAGNOSIS — M545 Low back pain, unspecified: Secondary | ICD-10-CM

## 2019-10-18 MED ORDER — TRAMADOL HCL 50 MG PO TABS: 50.0000 mg | ORAL_TABLET | Freq: Two times a day (BID) | ORAL | 2 refills | Status: DC | PRN

## 2019-10-18 MED ORDER — CYCLOBENZAPRINE HCL 5 MG PO TABS: 5.0000 mg | ORAL_TABLET | Freq: Two times a day (BID) | ORAL | 3 refills | Status: DC | PRN

## 2019-10-18 MED FILL — traMADol HCL 50 MG TABS: 50 | 7 days supply | Qty: 30 | Fill #0

## 2019-10-18 MED FILL — CYCLOBENZAPRINE HCL 5 MG TA: 5 | 7 days supply | Qty: 30 | Fill #0

## 2019-10-18 NOTE — Progress Notes (Signed)
Office Visit Note   Patient: Traci Brown           Date of Birth: [birth year]           MRN: [id number] Visit Date: 10/18/2019              Requested by: No referring provider defined for this encounter. PCP: Patient, No Pcp Per   Assessment & Plan: Visit Diagnoses:  1. Acute right-sided low back pain without sciatica     Plan: Impression is right-sided lumbar strain.  I recommended relative rest and heat.  She is unable to tolerate GI side effects of NSAIDs therefore she will just take Tylenol.  I sent in a prescription for tramadol and flexeril.  She will follow-up with [place] as needed.  Follow-Up Instructions: Return if symptoms worsen or fail to improve.   Orders:  Orders Placed This Encounter  Procedures  . XR Lumbar Spine 2-3 Views   Meds ordered this encounter  Medications  . traMADol (ULTRAM) 50 MG tablet    Sig: Take 1-2 tablets (50-100 mg total) by mouth 2 (two) times daily as needed.    Dispense:  30 tablet    Refill:  2  . cyclobenzaprine (FLEXERIL) 5 MG tablet    Sig: Take 1-2 tablets (5-10 mg total) by mouth 2 (two) times daily as needed for muscle spasms.    Dispense:  30 tablet    Refill:  3      Procedures: No procedures performed   Clinical Data: No additional findings.   Subjective: Chief Complaint  Patient presents with  . Right Hip - Pain    HPI patient is a pleasant 64 year old female who comes in today for evaluation treatment recommendation of right hip pain.  On 10/09/2019, she slid on ice falling on her right lateral hip.  She was seen in the ED where x-rays of the hip were obtained.  They were negative for fracture.  Her pain has minimally improved.  The pain she has is to the lateral hip and right lower back.  No anterior thigh or groin pain.  Worse going from a seated to standing position as well as when she is lifting her leg.  She has been taking Tylenol and Advil without significant relief of symptoms.  No numbness, tingling or  burning.  Review of Systems as detailed in HPI.  All others reviewed and are negative.   Objective: Vital Signs: There were no vitals taken for this visit.  Physical Exam well-developed and well-nourished female no acute distress.  Alert and oriented x3.  Ortho Exam examination of her hips reveals a negative logroll.  No tenderness to the greater trochanter.  Negative straight leg raise.  Mild tenderness of the lumbar spine.  She has moderate paraspinous tenderness on the right lower lumbar area.  No focal weakness.  She is neurovascular intact distally.  Specialty Comments:  No specialty comments available.  Imaging: XR Lumbar Spine 2-3 Views  Result Date: 10/18/2019 No acute or structural abnormalities.    PMFS History: Patient Active Problem List   Diagnosis Date Noted  . Educated about COVID-19 virus infection 10/05/2019  . Chest pain 11/13/2018  . Preventative health care 06/18/2015  . Paroxysmal supraventricular tachycardia (HCC) 06/18/2015  . Abdominal pain, epigastric 06/18/2015  . Leukopenia 10/16/2014  . Rhabdomyolysis 10/09/2014  . Anemia 05/02/2014  . Thoracic spine pain 03/01/2014  . Hypertension 07/08/2012  . CKD (chronic kidney disease) stage 3, GFR 30-59 ml/min 07/08/2012  .  HLD (hyperlipidemia) 12/15/2011  . Leg pain 04/15/2011   Past Medical History:  Diagnosis Date  . Abdominal pain, epigastric 06/18/2015  . Anemia 05/02/2014  . CKD (chronic kidney disease) stage 3, GFR 30-59 ml/min 07/08/2012  . Constipation   . Elevated LFTs   . HLD (hyperlipidemia) 12/15/2011  . Hypernatremia 07/22/2012  . Hypertension 07/08/2012  . Leg pain 04/15/2011   Pt has been having cramps in the thigh region for the last 3-4 weeks. She has tried vimovo without full relief.    . Leukopenia 10/16/2014  . Paroxysmal supraventricular tachycardia ([place]) 06/18/2015  . Rhabdomyolysis 10/09/2014  . Thoracic spine pain 03/01/2014  . Vaginal atrophy     Family History  Problem Relation  Age of Onset  . Hypertension Mother   . Other Mother        WAR VICTIM IN [place]  . Other Father        WAR VICTIM IN [place]  . Heart disease Neg Hx     Past Surgical History:  Procedure Laterality Date  . BACK SURGERY    . CESAREAN SECTION  2010  . CHOLECYSTECTOMY    . LACERATION REPAIR Right 2010   ON HANDS AND FACE  . womb tied (in [place] exact procedure unknown)     Social History   Occupational History  . Occupation: Passenger transport manager: [employer]  Tobacco Use  . Smoking status: Never Smoker  . Smokeless tobacco: Never Used  Substance and Sexual Activity  . Alcohol use: No  . Drug use: No  . Sexual activity: Yes

## 2019-10-24 DIAGNOSIS — E785 Hyperlipidemia, unspecified: Secondary | ICD-10-CM | POA: Diagnosis not present

## 2019-10-24 DIAGNOSIS — I471 Supraventricular tachycardia: Secondary | ICD-10-CM | POA: Diagnosis not present

## 2019-10-24 DIAGNOSIS — I129 Hypertensive chronic kidney disease with stage 1 through stage 4 chronic kidney disease, or unspecified chronic kidney disease: Secondary | ICD-10-CM | POA: Diagnosis not present

## 2019-10-24 DIAGNOSIS — N183 Chronic kidney disease, stage 3 unspecified: Secondary | ICD-10-CM | POA: Diagnosis not present

## 2019-10-24 DIAGNOSIS — D649 Anemia, unspecified: Secondary | ICD-10-CM | POA: Diagnosis not present

## 2019-10-24 DIAGNOSIS — R945 Abnormal results of liver function studies: Secondary | ICD-10-CM | POA: Diagnosis not present

## 2019-10-24 DIAGNOSIS — Z0000 Encounter for general adult medical examination without abnormal findings: Secondary | ICD-10-CM | POA: Diagnosis not present

## 2019-10-24 DIAGNOSIS — M545 Low back pain: Secondary | ICD-10-CM | POA: Diagnosis not present

## 2019-10-30 MED FILL — POLY-IRON 150 MG CAPSULE: 150 | 90 days supply | Qty: 90 | Fill #0

## 2019-10-31 MED FILL — ATORVASTATIN 40 MG TABLET: 40 | 90 days supply | Qty: 90 | Fill #0

## 2019-11-12 ENCOUNTER — Encounter: Payer: Self-pay | Admitting: Cardiology

## 2019-11-12 DIAGNOSIS — R002 Palpitations: Secondary | ICD-10-CM | POA: Insufficient documentation

## 2019-11-12 NOTE — Progress Notes (Signed)
Cardiology Office Note   Date:  11/14/2019   ID:  Traci, Brown 1956-02-18, MRN [id number]  PCP:  Maurice Small, MD  Cardiologist:   No primary care provider on file. Referring:  ED  Chief Complaint  Patient presents with  . Palpitations      History of Present Illness: Traci Brown is a 64 y.o. female who is referred by ED for evaluation of palpitations.  The patient has a history of supraventricular tachycardia.  I looked through multiple records and I found evidence of this and ER visits.  I see NSVT in 2016.  In 2018 she was in the emergency room with heart rates said to be in the 180s but I did not see an EKG with SVT at that time.  Most recently she was in the emergency room in December with sudden onset tachycardia.  She appears to have SVT on the initial EKG.  There is mention that it did not respond to vagal maneuvers.  However, it did seem to break suddenly.  There is a mention that she saw one of our electrophysiologist but I do not see any record from this.  She had been prescribed beta-blockers in the past and will take this occasionally if she feels like she is starting to have tachypalpitations.  She had an echo in 2016 which was normal.     She says that these episodes are happening at least once a year.  She says she loses consciousness but what she describes is more of a controlled situation where she can lie down and put her head down but it might take a few hours for things to resolve.  Again she has had multiple ER visits.  In between these episodes she feels fine.  She is not having any chest discomfort, neck or arm discomfort.  She is not having any shortness of breath, PND or orthopnea.  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  Past Medical History:  Diagnosis Date  . Abdominal pain, epigastric 06/18/2015  . Anemia 05/02/2014  . CKD (chronic kidney disease) stage 3, GFR 30-59 ml/min 07/08/2012  . Elevated  LFTs   . HLD (hyperlipidemia) 12/15/2011  . Hypernatremia 07/22/2012  . Hypertension 07/08/2012  . Leukopenia 10/16/2014  . Paroxysmal supraventricular tachycardia (HCC) 06/18/2015  . Rhabdomyolysis 10/09/2014  . Thoracic spine pain 03/01/2014  . Vaginal atrophy     Past Surgical History:  Procedure Laterality Date  . BACK SURGERY    . CESAREAN SECTION  2010  . CHOLECYSTECTOMY    . LACERATION REPAIR Right 2010   ON HANDS AND FACE  . womb tied (in [place] exact procedure unknown)       Current Outpatient Medications  Medication Sig Dispense Refill  . acetaminophen (TYLENOL) 500 MG tablet Take 1 tablet (500 mg total) by mouth every 6 (six) hours as needed for moderate pain. 30 tablet 0  . aspirin EC 81 MG tablet Take 81 mg by mouth daily.    Marland Kitchen atorvastatin (LIPITOR) 40 MG tablet     . cyclobenzaprine (FLEXERIL) 5 MG tablet Take 1-2 tablets (5-10 mg total) by mouth 2 (two) times daily as needed for muscle spasms. 30 tablet 3  . gemfibrozil (LOPID) 600 MG tablet Take 1 tablet (600 mg total) by mouth 2 (two) times daily before a meal. 60 tablet 2  . Green Tea, Camillia sinensis, (GREEN TEA PO) Take 1 tablet by mouth daily.    Marland Kitchen  metoprolol tartrate (LOPRESSOR) 25 MG tablet Take 1 tablet (25 mg total) 2 (two) times daily by mouth. 60 tablet 2  . naproxen (NAPROSYN) 500 MG tablet Take 1 tablet (500 mg total) by mouth 2 (two) times daily between meals as needed for moderate pain. 30 tablet 0  . NONFORMULARY OR COMPOUNDED ITEM Apply 1-2 g topically 4 (four) times daily. 120 each 2  . omeprazole (PRILOSEC) 20 MG capsule Take 1 capsule (20 mg total) by mouth daily. 30 capsule 3  . POLY-IRON 150 150 MG capsule     . traMADol (ULTRAM) 50 MG tablet Take 1-2 tablets (50-100 mg total) by mouth 2 (two) times daily as needed. 30 tablet 2  . potassium chloride (K-DUR) 10 MEQ tablet Take 1 tablet (10 mEq total) 2 (two) times daily for 5 days by mouth. (Patient not taking: Reported on 08/22/2019) 10 tablet 0    Current Facility-Administered Medications  Medication Dose Route Frequency Provider Last Rate Last Admin  . betamethasone acetate-betamethasone sodium phosphate (CELESTONE) injection 3 mg  3 mg Intramuscular Once Edrick Kins, DPM        Allergies:   Patient has no known allergies.    Social History:  The patient  reports that she has never smoked. She has never used smokeless tobacco. She reports that she does not drink alcohol or use drugs.   Family History:  The patient's family history includes Hypertension in her mother; Other in her father and mother.    ROS:  Please see the history of present illness.   Otherwise, review of systems are positive for none.   All other systems are reviewed and negative.    PHYSICAL EXAM: VS:  BP 134/85   Pulse 85   Temp 97.8 F (36.6 C)   Resp (!) 21   Ht 5\' 6"  (1.676 m)   Wt 204 lb 9.6 oz (92.8 kg)   SpO2 97%   BMI 33.02 kg/m  , BMI Body mass index is 33.02 kg/m. GENERAL:  Well appearing HEENT:  Pupils equal round and reactive, fundi not visualized, oral mucosa unremarkable NECK:  No jugular venous distention, waveform within normal limits, carotid upstroke brisk and symmetric, no bruits, no thyromegaly LYMPHATICS:  No cervical, inguinal adenopathy LUNGS:  Clear to auscultation bilaterally BACK:  No CVA tenderness CHEST:  Unremarkable HEART:  PMI not displaced or sustained,S1 and S2 within normal limits, no S3, no S4, no clicks, no rubs, no murmurs ABD:  Flat, positive bowel sounds normal in frequency in pitch, no bruits, no rebound, no guarding, no midline pulsatile mass, no hepatomegaly, no splenomegaly EXT:  2 plus pulses throughout, no edema, no cyanosis no clubbing SKIN:  No rashes no nodules NEURO:  Cranial nerves II through XII grossly intact, motor grossly intact throughout PSYCH:  Cognitively intact, oriented to person place and time    EKG:  EKG is ordered today. The ekg ordered today demonstrates sinus rhythm, rate 86,  axis within normal limits,  no acute ST-T wave changes, QTC prolonged.   Recent Labs: 08/22/2019: BUN 14; Creatinine, Ser 1.28; Hemoglobin 10.2; Magnesium 2.0; Platelets 230; Potassium 5.1; Sodium 139; TSH 5.217    Lipid Panel    Component Value Date/Time   CHOL 422 (H) 06/18/2015 1601   TRIG 438 (H) 06/18/2015 1601   HDL 32 (L) 06/18/2015 1601   CHOLHDL 13.2 (H) 06/18/2015 1601   CHOLHDL 11.9 10/09/2014 1630   VLDL NOT CALC 10/09/2014 1630   [place] Comment 06/18/2015 1601  LDLDIRECT 187 (H) 10/11/2014 0240      Wt Readings from Last 3 Encounters:  11/14/19 204 lb 9.6 oz (92.8 kg)  08/22/19 200 lb (90.7 kg)  08/27/15 204 lb 14.4 oz (92.9 kg)      Other studies Reviewed: Additional studies/ records that were reviewed today include: Multiple ED visits. Review of the above records demonstrates:  Please see elsewhere in the note.     ASSESSMENT AND PLAN:  PALPITATIONS:   The patient has SVT and I will discuss this with the EP.  These are fairly significant recurrent events and she would like definitive therapy.  I think ablation would be indicated.  She would prefer not to take beta-blocker every day.  Of note she has had a slightly elevated TSH recently.  Labs otherwise were unremarkable.  HTN:   Her blood pressure is controlled.  She will continue meds as listed.  CKD IIIa: She has a stable creatinine 1.28.  DYSLIPIDEMIA: She has a significantly elevated cholesterol.  I will talk to her about this and suggest follow-up with our lipid clinic.  COVID EDUCATION: She has received both of her vaccines.  Current medicines are revie  Her blood pressure is controlled.  We will continue with the meds as listed.wed at length with the patient today.  The patient does not have concerns regarding medicines.  The following changes have been made:  no change  Labs/ tests ordered today include:   Orders Placed This Encounter  Procedures  . EKG 12-Lead     Disposition:   FU  with EP    Signed, Rollene Rotunda, MD  11/14/2019 5:29 PM    [facility]

## 2019-11-14 ENCOUNTER — Telehealth: Payer: Self-pay | Admitting: Orthopaedic Surgery

## 2019-11-14 ENCOUNTER — Encounter: Payer: Self-pay | Admitting: Orthopaedic Surgery

## 2019-11-14 ENCOUNTER — Ambulatory Visit (INDEPENDENT_AMBULATORY_CARE_PROVIDER_SITE_OTHER): Payer: 59 | Admitting: Cardiology

## 2019-11-14 ENCOUNTER — Other Ambulatory Visit: Payer: Self-pay

## 2019-11-14 ENCOUNTER — Encounter: Payer: Self-pay | Admitting: Cardiology

## 2019-11-14 VITALS — BP 134/85 | HR 85 | Temp 97.8°F | Resp 21 | Ht 66.0 in | Wt 204.6 lb

## 2019-11-14 DIAGNOSIS — N1831 Chronic kidney disease, stage 3a: Secondary | ICD-10-CM | POA: Diagnosis not present

## 2019-11-14 DIAGNOSIS — I471 Supraventricular tachycardia: Secondary | ICD-10-CM

## 2019-11-14 DIAGNOSIS — I10 Essential (primary) hypertension: Secondary | ICD-10-CM

## 2019-11-14 DIAGNOSIS — R002 Palpitations: Secondary | ICD-10-CM

## 2019-11-14 DIAGNOSIS — Z7189 Other specified counseling: Secondary | ICD-10-CM | POA: Diagnosis not present

## 2019-11-14 NOTE — Patient Instructions (Signed)
Medication Instructions:  No changes *If you need a refill on your cardiac medications before your next appointment, please call your pharmacy*  Lab Work: None  Testing/Procedures: None  Follow-Up: At [facility], you and your health needs are our priority.  As part of our continuing mission to provide you with exceptional heart care, we have created designated Provider Care Teams.  These Care Teams include your primary Cardiologist (physician) and Advanced Practice Providers (APPs -  Physician Assistants and Nurse Practitioners) who all work together to provide you with the care you need, when you need it.  We recommend signing up for the patient portal called "MyChart".  Sign up information is provided on this After Visit Summary.  MyChart is used to connect with patients for Virtual Visits (Telemedicine).  Patients are able to view lab/test results, encounter notes, upcoming appointments, etc.  Non-urgent messages can be sent to your provider as well.   To learn more about what you can do with MyChart, go to [email or web address].    Your next appointment:   No follow up appointment needed

## 2019-11-14 NOTE — Telephone Encounter (Signed)
Patient called.   She needs a letter from [place] that details her condition and the dates in which she was seen for it.Her job is requiring proof for her absence on 2/3-2/10.     She asked that it be printed and given a call when it's ready to be picked up.   Call back: [phone]

## 2019-11-14 NOTE — Telephone Encounter (Signed)
Letter made.

## 2019-12-06 DIAGNOSIS — Z01419 Encounter for gynecological examination (general) (routine) without abnormal findings: Secondary | ICD-10-CM | POA: Diagnosis not present

## 2019-12-06 DIAGNOSIS — R351 Nocturia: Secondary | ICD-10-CM | POA: Diagnosis not present

## 2019-12-13 ENCOUNTER — Institutional Professional Consult (permissible substitution): Payer: 59 | Admitting: Internal Medicine

## 2019-12-20 DIAGNOSIS — R945 Abnormal results of liver function studies: Secondary | ICD-10-CM | POA: Diagnosis not present

## 2020-01-03 ENCOUNTER — Ambulatory Visit: Payer: 59 | Admitting: Internal Medicine

## 2020-01-03 ENCOUNTER — Encounter: Payer: Self-pay | Admitting: Internal Medicine

## 2020-01-03 ENCOUNTER — Other Ambulatory Visit: Payer: Self-pay

## 2020-01-03 VITALS — BP 130/72 | HR 69 | Ht 69.0 in | Wt 205.0 lb

## 2020-01-03 DIAGNOSIS — I471 Supraventricular tachycardia: Secondary | ICD-10-CM

## 2020-01-03 DIAGNOSIS — R002 Palpitations: Secondary | ICD-10-CM

## 2020-01-03 NOTE — Progress Notes (Signed)
HPI Traci Brown is referred today by Dr. Percival Spanish for evaluation of SVT. She is a pleasant 64 yo woman who has a h/o SVT dating back several years. She has not had sycope but does note having episodes of dizziness when her heart races which occurs suddenly and without warning. She has documented SVT between 130-150/min. She has not had to take IV adenosine but notes that the episodes start and stop suddenly. Her ECG demonstrates a short RP tachycardia. She has taken metoprolol in the past without much improvement.  No Known Allergies   Current Outpatient Medications  Medication Sig Dispense Refill  . acetaminophen (TYLENOL) 500 MG tablet Take 1 tablet (500 mg total) by mouth every 6 (six) hours as needed for moderate pain. 30 tablet 0  . aspirin EC 81 MG tablet Take 81 mg by mouth once a week.    Marland Kitchen atorvastatin (LIPITOR) 40 MG tablet     . metoprolol tartrate (LOPRESSOR) 25 MG tablet Take 1 tablet (25 mg total) 2 (two) times daily by mouth. 60 tablet 2  . omeprazole (PRILOSEC) 20 MG capsule Take 1 capsule (20 mg total) by mouth daily. 30 capsule 3  . POLY-IRON 150 150 MG capsule     . traMADol (ULTRAM) 50 MG tablet Take 1-2 tablets (50-100 mg total) by mouth 2 (two) times daily as needed. 30 tablet 2   Current Facility-Administered Medications  Medication Dose Route Frequency Provider Last Rate Last Admin  . betamethasone acetate-betamethasone sodium phosphate (CELESTONE) injection 3 mg  3 mg Intramuscular Once Edrick Kins, DPM         Past Medical History:  Diagnosis Date  . Abdominal pain, epigastric 06/18/2015  . Anemia 05/02/2014  . CKD (chronic kidney disease) stage 3, GFR 30-59 ml/min 07/08/2012  . Elevated LFTs   . HLD (hyperlipidemia) 12/15/2011  . Hypernatremia 07/22/2012  . Hypertension 07/08/2012  . Leukopenia 10/16/2014  . Paroxysmal supraventricular tachycardia ([place]) 06/18/2015  . Rhabdomyolysis 10/09/2014  . Thoracic spine pain 03/01/2014  . Vaginal atrophy      ROS:   All systems reviewed and negative except as noted in the HPI.   Past Surgical History:  Procedure Laterality Date  . BACK SURGERY    . CESAREAN SECTION  2010  . CHOLECYSTECTOMY    . LACERATION REPAIR Right 2010   ON HANDS AND FACE  . womb tied (in [place] exact procedure unknown)       Family History  Problem Relation Age of Onset  . Hypertension Mother   . Other Mother        WAR VICTIM IN [place]  . Other Father        WAR VICTIM IN [place]  . Heart disease Neg Hx      Social History   Socioeconomic History  . Marital status: Single    Spouse name: Not on file  . Number of children: Not on file  . Years of education: Not on file  . Highest education level: Not on file  Occupational History  . Occupation: Passenger transport manager: [employer]  Tobacco Use  . Smoking status: Never Smoker  . Smokeless tobacco: Never Used  Substance and Sexual Activity  . Alcohol use: No  . Drug use: No  . Sexual activity: Yes  Other Topics Concern  . Not on file  Social History Narrative   Four children.  One lives I [place].  4 grands.     Social  Determinants of Health   Financial Resource Strain:   . Difficulty of Paying Living Expenses:   Food Insecurity:   . Worried About Programme researcher, broadcasting/film/video in the Last Year:   . Barista in the Last Year:   Transportation Needs:   . Freight forwarder (Medical):   Marland Kitchen Lack of Transportation (Non-Medical):   Physical Activity:   . Days of Exercise per Week:   . Minutes of Exercise per Session:   Stress:   . Feeling of Stress :   Social Connections:   . Frequency of Communication with Friends and Family:   . Frequency of Social Gatherings with Friends and Family:   . Attends Religious Services:   . Active Member of Clubs or Organizations:   . Attends Banker Meetings:   Marland Kitchen Marital Status:   Intimate Partner Violence:   . Fear of Current or Ex-Partner:   . Emotionally Abused:   Marland Kitchen Physically  Abused:   . Sexually Abused:      BP 130/72   Pulse 69   Ht 5\' 9"  (1.753 m)   Wt 205 lb (93 kg)   SpO2 97%   BMI 30.27 kg/m   Physical Exam:  Well appearing NAD HEENT: Unremarkable Neck:  No JVD, no thyromegally Lymphatics:  No adenopathy Back:  No CVA tenderness Lungs:  Clear with no wheezes HEART:  Regular rate rhythm, no murmurs, no rubs, no clicks Abd:  soft, positive bowel sounds, no organomegally, no rebound, no guarding Ext:  2 plus pulses, no edema, no cyanosis, no clubbing Skin:  No rashes no nodules Neuro:  CN II through XII intact, motor grossly intact  EKG - nsr with no pre-excitation.  DEVICE  Normal device function.  See PaceArt for details.   Assess/Plan: 1. SVT - I have discussed the treatment options with the patient and recommended EP study and catheter ablation of SVT. I have reviewed the risks/benefits/goals/expectations of the procedure and she will call if she wishes to proceed.  [place].D.

## 2020-01-03 NOTE — Patient Instructions (Addendum)
Medication Instructions:  Your physician recommends that you continue on your current medications as directed. Please refer to the Current Medication list given to you today.  Labwork: None ordered.  Testing/Procedures: Your physician has recommended that you have an ablation. Catheter ablation is a medical procedure used to treat some cardiac arrhythmias (irregular heartbeats). During catheter ablation, a long, thin, flexible tube is put into a blood vessel in your groin (upper thigh), or neck. This tube is called an ablation catheter. It is then guided to your heart through the blood vessel. Radio frequency waves destroy small areas of heart tissue where abnormal heartbeats may cause an arrhythmia to start. Please see the instruction sheet given to you today.   Follow-Up:  The following dates are available for this procedure:  June 1, 3, 10, 11, 14, 18 and 28  Any Other Special Instructions Will Be Listed Below (If Applicable).  If you need a refill on your cardiac medications before your next appointment, please call your pharmacy.    Cardiac Ablation Cardiac ablation is a procedure to disable (ablate) a small amount of heart tissue in very specific places. The heart has many electrical connections. Sometimes these connections are abnormal and can cause the heart to beat very fast or irregularly. Ablating some of the problem areas can improve the heart rhythm or return it to normal. Ablation may be done for people who:  Have Wolff-Parkinson-White syndrome.  Have fast heart rhythms (tachycardia).  Have taken medicines for an abnormal heart rhythm (arrhythmia) that were not effective or caused side effects.  Have a high-risk heartbeat that may be life-threatening. During the procedure, a small incision is made in the neck or the groin, and a long, thin, flexible tube (catheter) is inserted into the incision and moved to the heart. Small devices (electrodes) on the tip of the catheter  will send out electrical currents. A type of X-ray (fluoroscopy) will be used to help guide the catheter and to provide images of the heart. Tell a health care provider about:  Any allergies you have.  All medicines you are taking, including vitamins, herbs, eye drops, creams, and over-the-counter medicines.  Any problems you or family members have had with anesthetic medicines.  Any blood disorders you have.  Any surgeries you have had.  Any medical conditions you have, such as kidney failure.  Whether you are pregnant or may be pregnant. What are the risks? Generally, this is a safe procedure. However, problems may occur, including:  Infection.  Bruising and bleeding at the catheter insertion site.  Bleeding into the chest, especially into the sac that surrounds the heart. This is a serious complication.  Stroke or blood clots.  Damage to other structures or organs.  Allergic reaction to medicines or dyes.  Need for a permanent pacemaker if the normal electrical system is damaged. A pacemaker is a small computer that sends electrical signals to the heart and helps your heart beat normally.  The procedure not being fully effective. This may not be recognized until months later. Repeat ablation procedures are sometimes required. What happens before the procedure?  Follow instructions from your health care provider about eating or drinking restrictions.  Ask your health care provider about: ? Changing or stopping your regular medicines. This is especially important if you are taking diabetes medicines or blood thinners. ? Taking medicines such as aspirin and ibuprofen. These medicines can thin your blood. Do not take these medicines before your procedure if your health care provider  instructs you not to.  Plan to have someone take you home from the hospital or clinic.  If you will be going home right after the procedure, plan to have someone with you for 24 hours. What  happens during the procedure?  To lower your risk of infection: ? Your health care team will wash or sanitize their hands. ? Your skin will be washed with soap. ? Hair may be removed from the incision area.  An IV tube will be inserted into one of your veins.  You will be given a medicine to help you relax (sedative).  The skin on your neck or groin will be numbed.  An incision will be made in your neck or your groin.  A needle will be inserted through the incision and into a large vein in your neck or groin.  A catheter will be inserted into the needle and moved to your heart.  Dye may be injected through the catheter to help your surgeon see the area of the heart that needs treatment.  Electrical currents will be sent from the catheter to ablate heart tissue in desired areas. There are three types of energy that may be used to ablate heart tissue: ? Heat (radiofrequency energy). ? Laser energy. ? Extreme cold (cryoablation).  When the necessary tissue has been ablated, the catheter will be removed.  Pressure will be held on the catheter insertion area to prevent excessive bleeding.  A bandage (dressing) will be placed over the catheter insertion area. The procedure may vary among health care providers and hospitals. What happens after the procedure?  Your blood pressure, heart rate, breathing rate, and blood oxygen level will be monitored until the medicines you were given have worn off.  Your catheter insertion area will be monitored for bleeding. You will need to lie still for a few hours to ensure that you do not bleed from the catheter insertion area.  Do not drive for 24 hours or as long as directed by your health care provider. Summary  Cardiac ablation is a procedure to disable (ablate) a small amount of heart tissue in very specific places. Ablating some of the problem areas can improve the heart rhythm or return it to normal.  During the procedure, electrical  currents will be sent from the catheter to ablate heart tissue in desired areas. This information is not intended to replace advice given to you by your health care provider. Make sure you discuss any questions you have with your health care provider. Document Revised: 02/14/2018 Document Reviewed: 07/13/2016 Elsevier Patient Education  2020 [employer].

## 2020-01-05 ENCOUNTER — Telehealth: Payer: Self-pay | Admitting: Internal Medicine

## 2020-01-05 DIAGNOSIS — I471 Supraventricular tachycardia: Secondary | ICD-10-CM

## 2020-01-05 NOTE — Telephone Encounter (Signed)
Patient called to set up cardiac ablation. She would like to schedule for June 1st.

## 2020-01-05 NOTE — Telephone Encounter (Signed)
Returned call to Pt.  Left VM.  Advised would schedule Pt for June 1.  Need to schedule labs/covid test.  Requested Pt call back and request to speak with this nurse.  Otherwise, will call on Monday.

## 2020-01-08 NOTE — Telephone Encounter (Signed)
Spoke with Pt  Pt scheduled for February 06 2020 for SVT ablation  Labs/covid test scheduled  Sending instructions in the mail.  Work up complete

## 2020-01-16 ENCOUNTER — Other Ambulatory Visit: Payer: Self-pay | Admitting: Family Medicine

## 2020-01-16 DIAGNOSIS — Z1231 Encounter for screening mammogram for malignant neoplasm of breast: Secondary | ICD-10-CM

## 2020-01-29 ENCOUNTER — Other Ambulatory Visit: Payer: Self-pay

## 2020-01-29 ENCOUNTER — Other Ambulatory Visit: Payer: 59 | Admitting: *Deleted

## 2020-01-29 DIAGNOSIS — I471 Supraventricular tachycardia: Secondary | ICD-10-CM | POA: Diagnosis not present

## 2020-01-29 LAB — CBC WITH DIFFERENTIAL/PLATELET
Basophils Absolute: 0 10*3/uL (ref 0.0–0.2)
Basos: 1 %
EOS (ABSOLUTE): 0.2 10*3/uL (ref 0.0–0.4)
Eos: 5 %
Hematocrit: 29 % — ABNORMAL LOW (ref 34.0–46.6)
Hemoglobin: 9.4 g/dL — ABNORMAL LOW (ref 11.1–15.9)
Lymphocytes Absolute: 2.2 10*3/uL (ref 0.7–3.1)
Lymphs: 54 %
MCH: 26.5 pg — ABNORMAL LOW (ref 26.6–33.0)
MCHC: 32.4 g/dL (ref 31.5–35.7)
MCV: 82 fL (ref 79–97)
Monocytes Absolute: 0.3 10*3/uL (ref 0.1–0.9)
Monocytes: 7 %
Neutrophils Absolute: 1.3 10*3/uL — ABNORMAL LOW (ref 1.4–7.0)
Neutrophils: 33 %
Platelets: 227 10*3/uL (ref 150–450)
RBC: 3.55 x10E6/uL — ABNORMAL LOW (ref 3.77–5.28)
RDW: 16.9 % — ABNORMAL HIGH (ref 11.7–15.4)
WBC: 4 10*3/uL (ref 3.4–10.8)

## 2020-01-29 LAB — BASIC METABOLIC PANEL
BUN/Creatinine Ratio: 12 (ref 12–28)
BUN: 18 mg/dL (ref 8–27)
CO2: 27 mmol/L (ref 20–29)
Calcium: 9.7 mg/dL (ref 8.7–10.3)
Chloride: 103 mmol/L (ref 96–106)
Creatinine, Ser: 1.5 mg/dL — ABNORMAL HIGH (ref 0.57–1.00)
GFR calc Af Amer: 43 mL/min/{1.73_m2} — ABNORMAL LOW (ref >59)
GFR calc non Af Amer: 37 mL/min/{1.73_m2} — ABNORMAL LOW (ref >59)
Glucose: 92 mg/dL (ref 65–99)
Potassium: 3.9 mmol/L (ref 3.5–5.2)
Sodium: 140 mmol/L (ref 134–144)

## 2020-02-03 ENCOUNTER — Other Ambulatory Visit (HOSPITAL_COMMUNITY)
Admission: RE | Admit: 2020-02-03 | Discharge: 2020-02-03 | Disposition: A | Discharge status: Home / Self Care | Payer: 59 | Source: Ambulatory Visit | Attending: Internal Medicine | Admitting: Internal Medicine

## 2020-02-03 DIAGNOSIS — Z20822 Contact with and (suspected) exposure to covid-19: Secondary | ICD-10-CM | POA: Insufficient documentation

## 2020-02-03 DIAGNOSIS — Z01812 Encounter for preprocedural laboratory examination: Secondary | ICD-10-CM | POA: Diagnosis not present

## 2020-02-03 LAB — SARS CORONAVIRUS 2 (TAT 6-24 HRS): SARS Coronavirus 2: NEGATIVE

## 2020-02-06 ENCOUNTER — Other Ambulatory Visit: Payer: Self-pay

## 2020-02-06 ENCOUNTER — Encounter (HOSPITAL_COMMUNITY)
Admission: RE | Disposition: A | Discharge status: Home / Self Care | Payer: Self-pay | Source: Home / Self Care | Attending: Internal Medicine

## 2020-02-06 ENCOUNTER — Ambulatory Visit (HOSPITAL_COMMUNITY)
Admission: RE | Admit: 2020-02-06 | Discharge: 2020-02-06 | Disposition: A | Discharge status: Home / Self Care | Payer: 59 | Attending: Internal Medicine | Admitting: Internal Medicine

## 2020-02-06 DIAGNOSIS — Z7982 Long term (current) use of aspirin: Secondary | ICD-10-CM | POA: Insufficient documentation

## 2020-02-06 DIAGNOSIS — N183 Chronic kidney disease, stage 3 unspecified: Secondary | ICD-10-CM | POA: Insufficient documentation

## 2020-02-06 DIAGNOSIS — I129 Hypertensive chronic kidney disease with stage 1 through stage 4 chronic kidney disease, or unspecified chronic kidney disease: Secondary | ICD-10-CM | POA: Diagnosis not present

## 2020-02-06 DIAGNOSIS — I471 Supraventricular tachycardia: Secondary | ICD-10-CM | POA: Insufficient documentation

## 2020-02-06 DIAGNOSIS — D649 Anemia, unspecified: Secondary | ICD-10-CM | POA: Insufficient documentation

## 2020-02-06 DIAGNOSIS — Z79899 Other long term (current) drug therapy: Secondary | ICD-10-CM | POA: Diagnosis not present

## 2020-02-06 DIAGNOSIS — E785 Hyperlipidemia, unspecified: Secondary | ICD-10-CM | POA: Insufficient documentation

## 2020-02-06 HISTORY — PX: SVT ABLATION: EP1225

## 2020-02-06 SURGERY — SVT ABLATION

## 2020-02-06 MED ORDER — MIDAZOLAM HCL 5 MG/5ML IJ SOLN
INTRAMUSCULAR | Status: AC
  Filled 2020-02-06: qty 5

## 2020-02-06 MED ORDER — HEPARIN (PORCINE) IN NACL 1000-0.9 UT/500ML-% IV SOLN
INTRAVENOUS | Status: DC | PRN
  Administered 2020-02-06: 500 mL

## 2020-02-06 MED ORDER — BUPIVACAINE HCL (PF) 0.25 % IJ SOLN
INTRAMUSCULAR | Status: DC | PRN
  Administered 2020-02-06: 60 mL

## 2020-02-06 MED ORDER — BUPIVACAINE HCL (PF) 0.25 % IJ SOLN
INTRAMUSCULAR | Status: AC
  Filled 2020-02-06: qty 60

## 2020-02-06 MED ORDER — SODIUM CHLORIDE 0.9% FLUSH: 3.0000 mL | INTRAVENOUS | Status: DC | PRN

## 2020-02-06 MED ORDER — SODIUM CHLORIDE 0.9% FLUSH: 3.0000 mL | Freq: Two times a day (BID) | INTRAVENOUS | Status: DC

## 2020-02-06 MED ORDER — MIDAZOLAM HCL 5 MG/5ML IJ SOLN
INTRAMUSCULAR | Status: DC | PRN
  Administered 2020-02-06: 1 mg via INTRAVENOUS
  Administered 2020-02-06: 2 mg via INTRAVENOUS
  Administered 2020-02-06 (×4): 1 mg via INTRAVENOUS

## 2020-02-06 MED ORDER — ISOPROTERENOL HCL 0.2 MG/ML IJ SOLN
INTRAMUSCULAR | Status: AC
  Filled 2020-02-06: qty 5

## 2020-02-06 MED ORDER — ACETAMINOPHEN 325 MG PO TABS
650.0000 mg | ORAL_TABLET | ORAL | Status: DC | PRN
  Filled 2020-02-06: qty 2

## 2020-02-06 MED ORDER — SODIUM CHLORIDE 0.9 % IV SOLN
INTRAVENOUS | Status: DC | PRN
  Administered 2020-02-06: 1 ug/min via INTRAVENOUS

## 2020-02-06 MED ORDER — FENTANYL CITRATE (PF) 100 MCG/2ML IJ SOLN
INTRAMUSCULAR | Status: DC | PRN
  Administered 2020-02-06: 12.5 ug via INTRAVENOUS
  Administered 2020-02-06: 25 ug via INTRAVENOUS
  Administered 2020-02-06 (×4): 12.5 ug via INTRAVENOUS

## 2020-02-06 MED ORDER — ONDANSETRON HCL 4 MG/2ML IJ SOLN: 4.0000 mg | Freq: Four times a day (QID) | INTRAMUSCULAR | Status: DC | PRN

## 2020-02-06 MED ORDER — FENTANYL CITRATE (PF) 100 MCG/2ML IJ SOLN
INTRAMUSCULAR | Status: AC
  Filled 2020-02-06: qty 2

## 2020-02-06 MED ORDER — SODIUM CHLORIDE 0.9 % IV SOLN: 250.0000 mL | INTRAVENOUS | Status: DC | PRN

## 2020-02-06 MED ORDER — SODIUM CHLORIDE 0.9 % IV SOLN: INTRAVENOUS | Status: DC

## 2020-02-06 SURGICAL SUPPLY — 10 items
BAG SNAP BAND KOVER 36X36 (MISCELLANEOUS) ×2 IMPLANT
CATH CELSIUS THERM D CV 7F (ABLATOR) ×2 IMPLANT
CATH HEX JOS 2-5-2 65CM 6F REP (CATHETERS) ×2 IMPLANT
CATH JOSEPH QUAD ALLRED 6F REP (CATHETERS) ×4 IMPLANT
PACK EP LATEX FREE (CUSTOM PROCEDURE TRAY) ×3
PACK EP LF (CUSTOM PROCEDURE TRAY) ×1 IMPLANT
PAD PRO RADIOLUCENT 2001M-C (PAD) ×5 IMPLANT
SHEATH PINNACLE 6F 10CM (SHEATH) ×4 IMPLANT
SHEATH PINNACLE 7F 10CM (SHEATH) ×2 IMPLANT
SHEATH PINNACLE 8F 10CM (SHEATH) ×2 IMPLANT

## 2020-02-06 NOTE — Progress Notes (Signed)
Pt R IJ removed @ 11:30. Level 0 pre and post sheath pull. Pt tolerated well. Dressing clean,dry and intact.  Lacy Duverney, RN

## 2020-02-06 NOTE — H&P (Signed)
HPI Traci Brown is referred today by Dr. Percival Spanish for evaluation of SVT. She is a pleasant 64 yo woman who has a h/o SVT dating back several years. She has not had sycope but does note having episodes of dizziness when her heart races which occurs suddenly and without warning. She has documented SVT between 130-150/min. She has not had to take IV adenosine but notes that the episodes start and stop suddenly. Her ECG demonstrates a short RP tachycardia. She has taken metoprolol in the past without much improvement.  No Known Allergies         Current Outpatient Medications  Medication Sig Dispense Refill  . acetaminophen (TYLENOL) 500 MG tablet Take 1 tablet (500 mg total) by mouth every 6 (six) hours as needed for moderate pain. 30 tablet 0  . aspirin EC 81 MG tablet Take 81 mg by mouth once a week.    Marland Kitchen atorvastatin (LIPITOR) 40 MG tablet     . metoprolol tartrate (LOPRESSOR) 25 MG tablet Take 1 tablet (25 mg total) 2 (two) times daily by mouth. 60 tablet 2  . omeprazole (PRILOSEC) 20 MG capsule Take 1 capsule (20 mg total) by mouth daily. 30 capsule 3  . POLY-IRON 150 150 MG capsule     . traMADol (ULTRAM) 50 MG tablet Take 1-2 tablets (50-100 mg total) by mouth 2 (two) times daily as needed. 30 tablet 2            Current Facility-Administered Medications  Medication Dose Route Frequency Provider Last Rate Last Admin  . betamethasone acetate-betamethasone sodium phosphate (CELESTONE) injection 3 mg  3 mg Intramuscular Once Edrick Kins, DPM             Past Medical History:  Diagnosis Date  . Abdominal pain, epigastric 06/18/2015  . Anemia 05/02/2014  . CKD (chronic kidney disease) stage 3, GFR 30-59 ml/min 07/08/2012  . Elevated LFTs   . HLD (hyperlipidemia) 12/15/2011  . Hypernatremia 07/22/2012  . Hypertension 07/08/2012  . Leukopenia 10/16/2014  . Paroxysmal supraventricular tachycardia ([place]) 06/18/2015  . Rhabdomyolysis 10/09/2014  . Thoracic spine  pain 03/01/2014  . Vaginal atrophy     ROS:   All systems reviewed and negative except as noted in the HPI.        Past Surgical History:  Procedure Laterality Date  . BACK SURGERY    . CESAREAN SECTION  2010  . CHOLECYSTECTOMY    . LACERATION REPAIR Right 2010   ON HANDS AND FACE  . womb tied (in [place] exact procedure unknown)            Family History  Problem Relation Age of Onset  . Hypertension Mother   . Other Mother        WAR VICTIM IN [place]  . Other Father        WAR VICTIM IN [place]  . Heart disease Neg Hx      Social History        Socioeconomic History  . Marital status: Single    Spouse name: Not on file  . Number of children: Not on file  . Years of education: Not on file  . Highest education level: Not on file  Occupational History  . Occupation: Passenger transport manager: [employer]  Tobacco Use  . Smoking status: Never Smoker  . Smokeless tobacco: Never Used  Substance and Sexual Activity  . Alcohol use: No  . Drug use: No  .  Sexual activity: Yes  Other Topics Concern  . Not on file  Social History Narrative   Four children.  One lives I [place].  4 grands.     Social Determinants of Health      Financial Resource Strain:   . Difficulty of Paying Living Expenses:   Food Insecurity:   . Worried About Programme researcher, broadcasting/film/video in the Last Year:   . Barista in the Last Year:   Transportation Needs:   . Freight forwarder (Medical):   Marland Kitchen Lack of Transportation (Non-Medical):   Physical Activity:   . Days of Exercise per Week:   . Minutes of Exercise per Session:   Stress:   . Feeling of Stress :   Social Connections:   . Frequency of Communication with Friends and Family:   . Frequency of Social Gatherings with Friends and Family:   . Attends Religious Services:   . Active Member of Clubs or Organizations:   . Attends Banker Meetings:   Marland Kitchen Marital Status:   Intimate  Partner Violence:   . Fear of Current or Ex-Partner:   . Emotionally Abused:   Marland Kitchen Physically Abused:   . Sexually Abused:      BP 130/72   Pulse 69   Ht 5\' 9"  (1.753 m)   Wt 205 lb (93 kg)   SpO2 97%   BMI 30.27 kg/m   Physical Exam:  Well appearing NAD HEENT: Unremarkable Neck:  No JVD, no thyromegally Lymphatics:  No adenopathy Back:  No CVA tenderness Lungs:  Clear with no wheezes HEART:  Regular rate rhythm, no murmurs, no rubs, no clicks Abd:  soft, positive bowel sounds, no organomegally, no rebound, no guarding Ext:  2 plus pulses, no edema, no cyanosis, no clubbing Skin:  No rashes no nodules Neuro:  CN II through XII intact, motor grossly intact  EKG - nsr with no pre-excitation.  DEVICE  Normal device function.  See PaceArt for details.   Assess/Plan: 1. SVT - I have discussed the treatment options with the patient and recommended EP study and catheter ablation of SVT. I have reviewed the risks/benefits/goals/expectations of the procedure and she will call if she wishes to proceed.  [place].   EP Attending  Patient seen and examined. Agree with above. Since her prior clinic visit, no change in the history, exam, assessment and plan. For EPS/RFA of SVT.  Gerhard Munch.D.

## 2020-02-06 NOTE — Discharge Instructions (Signed)
Post procedure care instructions No driving for 4 days. No lifting over 5 lbs for 1 week. No vigorous or sexual activity for 1 week. You may return to work without restrictions/your usual activities on Monday 02/12/2020. Keep procedure site clean & dry. If you notice increased pain, swelling, bleeding or pus, call/return!  You may shower, but no soaking baths/hot tubs/pools for 1 week.

## 2020-02-06 NOTE — Progress Notes (Signed)
Patient and daughter was given discharge instructions. Both verbalized understanding. 

## 2020-02-06 NOTE — Progress Notes (Signed)
Site area: Right groin a 6 french X 2 and 8 french venous sheath was removed  Site Prior to Removal:  Level 0  Pressure Applied For 15 MINUTES    Bedrest Beginning at 1140am  Manual:   Yes.    Patient Status During Pull:  stable  Post Pull Groin Site:  Level 0  Post Pull Instructions Given:  Yes.    Post Pull Pulses Present:  Yes.    Dressing Applied:  Yes.    Comments:

## 2020-02-14 ENCOUNTER — Ambulatory Visit
Admission: RE | Admit: 2020-02-14 | Discharge: 2020-02-14 | Disposition: A | Discharge status: Home / Self Care | Payer: 59 | Source: Ambulatory Visit | Attending: Family Medicine | Admitting: Family Medicine

## 2020-02-14 ENCOUNTER — Other Ambulatory Visit: Payer: Self-pay

## 2020-02-14 DIAGNOSIS — Z1231 Encounter for screening mammogram for malignant neoplasm of breast: Secondary | ICD-10-CM

## 2020-02-15 ENCOUNTER — Other Ambulatory Visit: Payer: Self-pay

## 2020-02-15 ENCOUNTER — Ambulatory Visit: Payer: Self-pay

## 2020-02-15 ENCOUNTER — Ambulatory Visit: Payer: 59 | Admitting: Orthopaedic Surgery

## 2020-02-15 DIAGNOSIS — M545 Low back pain: Secondary | ICD-10-CM | POA: Diagnosis not present

## 2020-02-15 DIAGNOSIS — G8929 Other chronic pain: Secondary | ICD-10-CM

## 2020-02-15 MED ORDER — TRAMADOL HCL 50 MG PO TABS: 50.0000 mg | ORAL_TABLET | Freq: Every day | ORAL | 0 refills | Status: DC | PRN

## 2020-02-15 NOTE — Progress Notes (Signed)
Office Visit Note   Patient: Traci Brown           Date of Birth: 20-Sep-1955           MRN: [id number] Visit Date: 02/15/2020              Requested by: Maurice Small, MD [address] [place],  [place] [address] PCP: Maurice Small, MD   Assessment & Plan: Visit Diagnoses:  1. Chronic bilateral low back pain without sciatica     Plan: At this point patient has failed conservative treatment for the last 4 months. We will obtain MRI of the lumbar spine to rule out structural abnormalities. Prescription for tramadol. Follow-up after the MRI.  Follow-Up Instructions: Return if symptoms worsen or fail to improve.   Orders:  Orders Placed This Encounter  Procedures  . XR Lumbar Spine 2-3 Views   Meds ordered this encounter  Medications  . traMADol (ULTRAM) 50 MG tablet    Sig: Take 1-2 tablets (50-100 mg total) by mouth daily as needed.    Dispense:  20 tablet    Refill:  0      Procedures: No procedures performed   Clinical Data: No additional findings.   Subjective: Chief Complaint  Patient presents with  . Lower Back - Pain    [name] returns today for chronic back pain and right leg radiculopathy. She has not received any relief from conservative treatment since the last visit. Denies any bowel or bladder dysfunction.   Review of Systems   Objective: Vital Signs: There were no vitals taken for this visit.  Physical Exam  Ortho Exam Exam is unchanged. Specialty Comments:  No specialty comments available.  Imaging: XR Lumbar Spine 2-3 Views  Result Date: 02/15/2020 No acute or structural abnormalities.  Mild spondylosis.      PMFS History: Patient Active Problem List   Diagnosis Date Noted  . Palpitations 11/12/2019  . Educated about COVID-19 virus infection 10/05/2019  . Chest pain 11/13/2018  . Preventative health care 06/18/2015  . Paroxysmal supraventricular tachycardia ([facility]) 06/18/2015  . Abdominal pain,  epigastric 06/18/2015  . Leukopenia 10/16/2014  . Rhabdomyolysis 10/09/2014  . Anemia 05/02/2014  . Thoracic spine pain 03/01/2014  . Hypertension 07/08/2012  . CKD (chronic kidney disease) stage 3, GFR 30-59 ml/min 07/08/2012  . HLD (hyperlipidemia) 12/15/2011  . Leg pain 04/15/2011   Past Medical History:  Diagnosis Date  . Abdominal pain, epigastric 06/18/2015  . Anemia 05/02/2014  . CKD (chronic kidney disease) stage 3, GFR 30-59 ml/min 07/08/2012  . Elevated LFTs   . HLD (hyperlipidemia) 12/15/2011  . Hypernatremia 07/22/2012  . Hypertension 07/08/2012  . Leukopenia 10/16/2014  . Paroxysmal supraventricular tachycardia ([facility]) 06/18/2015  . Rhabdomyolysis 10/09/2014  . Thoracic spine pain 03/01/2014  . Vaginal atrophy     Family History  Problem Relation Age of Onset  . Hypertension Mother   . Other Mother        WAR VICTIM IN [place]  . Other Father        WAR VICTIM IN [place]  . Heart disease Neg Hx     Past Surgical History:  Procedure Laterality Date  . BACK SURGERY    . CESAREAN SECTION  2010  . CHOLECYSTECTOMY    . LACERATION REPAIR Right 2010   ON HANDS AND FACE  . SVT ABLATION N/A 02/06/2020   Procedure: SVT ABLATION;  Surgeon: Marinus Maw, MD;  Location: [facility] INVASIVE CV LAB;  Service: Cardiovascular;  Laterality: N/A;  . womb tied (in [place] exact procedure unknown)     Social History   Occupational History  . Occupation: Passenger transport manager: [employer]  Tobacco Use  . Smoking status: Never Smoker  . Smokeless tobacco: Never Used  Substance and Sexual Activity  . Alcohol use: No  . Drug use: No  . Sexual activity: Yes

## 2020-02-21 ENCOUNTER — Telehealth: Payer: Self-pay | Admitting: Orthopaedic Surgery

## 2020-02-21 NOTE — Telephone Encounter (Signed)
Pt called stating the restrictions given to her has been causing problems @ work; pt states her job doesn't have any light work for her to do. Pt wanted to keep Dr.Xu aware of everything and states she will talk to her job some more and update [place].   [phone]

## 2020-02-21 NOTE — Telephone Encounter (Signed)
FYI

## 2020-02-23 ENCOUNTER — Telehealth: Payer: Self-pay | Admitting: Orthopaedic Surgery

## 2020-02-23 NOTE — Telephone Encounter (Signed)
Should this go to [facility]?

## 2020-02-23 NOTE — Telephone Encounter (Signed)
Pt called stating [employer] should be faxing over some FMLA paperwork and she would like a CB when we receive it so she can come pay the $25 fee.   [phone]

## 2020-03-08 ENCOUNTER — Encounter: Payer: Self-pay | Admitting: Internal Medicine

## 2020-03-08 ENCOUNTER — Ambulatory Visit: Payer: 59 | Admitting: Internal Medicine

## 2020-03-08 ENCOUNTER — Other Ambulatory Visit: Payer: Self-pay

## 2020-03-08 VITALS — BP 150/92 | HR 77 | Ht 69.0 in | Wt 192.0 lb

## 2020-03-08 DIAGNOSIS — I471 Supraventricular tachycardia: Secondary | ICD-10-CM

## 2020-03-08 MED ORDER — AMLODIPINE BESYLATE 5 MG PO TABS
5.0000 mg | ORAL_TABLET | Freq: Every day | ORAL | 3 refills | Status: DC
Start: 2020-03-08 — End: 2023-12-08

## 2020-03-08 MED FILL — AMLODIPINE BESYLATE 5 MG TA: 5 | 90 days supply | Qty: 90 | Fill #0

## 2020-03-08 NOTE — Patient Instructions (Addendum)
Medication Instructions:  Your physician has recommended you make the following change in your medication:   1.  Start taking amlodipine 5 mg- one tablet by mouth daily   *If you need a refill on your cardiac medications before your next appointment, please call your pharmacy*  Lab Work: None ordered.  If you have labs (blood work) drawn today and your tests are completely normal, you will receive your results only by: Marland Kitchen MyChart Message (if you have MyChart) OR . A paper copy in the mail If you have any lab test that is abnormal or we need to change your treatment, we will call you to review the results.  Testing/Procedures: None ordered.  Follow-Up: At [facility], you and your health needs are our priority.  As part of our continuing mission to provide you with exceptional heart care, we have created designated Provider Care Teams.  These Care Teams include your primary Cardiologist (physician) and Advanced Practice Providers (APPs -  Physician Assistants and Nurse Practitioners) who all work together to provide you with the care you need, when you need it.  We recommend signing up for the patient portal called "MyChart".  Sign up information is provided on this After Visit Summary.  MyChart is used to connect with patients for Virtual Visits (Telemedicine).  Patients are able to view lab/test results, encounter notes, upcoming appointments, etc.  Non-urgent messages can be sent to your provider as well.   To learn more about what you can do with MyChart, go to [email or web address].    Your next appointment:   Your physician wants you to follow-up in: as needed with [name].    Other Instructions:

## 2020-03-08 NOTE — Progress Notes (Signed)
HPI [name] returns today for followup. She is a pleasant 64 yo woman with SVT who underwent EP study and catheter ablation approx 5 weeks ago. She has done well in the interim. She has rare, brief palpitations. She has not had recurrent SVT.  No Known Allergies   Current Outpatient Medications  Medication Sig Dispense Refill  . acetaminophen (TYLENOL) 500 MG tablet Take 1 tablet (500 mg total) by mouth every 6 (six) hours as needed for moderate pain. 30 tablet 0  . atorvastatin (LIPITOR) 40 MG tablet     . POLY-IRON 150 150 MG capsule     . traMADol (ULTRAM) 50 MG tablet Take 1-2 tablets (50-100 mg total) by mouth daily as needed. 20 tablet 0  . amLODipine (NORVASC) 5 MG tablet Take 1 tablet (5 mg total) by mouth daily. 90 tablet 3   Current Facility-Administered Medications  Medication Dose Route Frequency Provider Last Rate Last Admin  . betamethasone acetate-betamethasone sodium phosphate (CELESTONE) injection 3 mg  3 mg Intramuscular Once Felecia Shelling, DPM         Past Medical History:  Diagnosis Date  . Abdominal pain, epigastric 06/18/2015  . Anemia 05/02/2014  . CKD (chronic kidney disease) stage 3, GFR 30-59 ml/min 07/08/2012  . Elevated LFTs   . HLD (hyperlipidemia) 12/15/2011  . Hypernatremia 07/22/2012  . Hypertension 07/08/2012  . Leukopenia 10/16/2014  . Paroxysmal supraventricular tachycardia (HCC) 06/18/2015  . Rhabdomyolysis 10/09/2014  . Thoracic spine pain 03/01/2014  . Vaginal atrophy     ROS:   All systems reviewed and negative except as noted in the HPI.   Past Surgical History:  Procedure Laterality Date  . BACK SURGERY    . CESAREAN SECTION  2010  . CHOLECYSTECTOMY    . LACERATION REPAIR Right 2010   ON HANDS AND FACE  . SVT ABLATION N/A 02/06/2020   Procedure: SVT ABLATION;  Surgeon: Marinus Maw, MD;  Location: [facility] INVASIVE CV LAB;  Service: Cardiovascular;  Laterality: N/A;  . womb tied (in [place] exact procedure unknown)        Family History  Problem Relation Age of Onset  . Hypertension Mother   . Other Mother        WAR VICTIM IN [place]  . Other Father        WAR VICTIM IN [place]  . Heart disease Neg Hx      Social History   Socioeconomic History  . Marital status: Single    Spouse name: Not on file  . Number of children: Not on file  . Years of education: Not on file  . Highest education level: Not on file  Occupational History  . Occupation: Oncologist: [employer]  Tobacco Use  . Smoking status: Never Smoker  . Smokeless tobacco: Never Used  Substance and Sexual Activity  . Alcohol use: No  . Drug use: No  . Sexual activity: Yes  Other Topics Concern  . Not on file  Social History Narrative   Four children.  One lives I [place].  4 grands.     Social Determinants of Health   Financial Resource Strain:   . Difficulty of Paying Living Expenses:   Food Insecurity:   . Worried About Programme researcher, broadcasting/film/video in the Last Year:   . Barista in the Last Year:   Transportation Needs:   . Freight forwarder (Medical):   Marland Kitchen Lack of  Transportation (Non-Medical):   Physical Activity:   . Days of Exercise per Week:   . Minutes of Exercise per Session:   Stress:   . Feeling of Stress :   Social Connections:   . Frequency of Communication with Friends and Family:   . Frequency of Social Gatherings with Friends and Family:   . Attends Religious Services:   . Active Member of Clubs or Organizations:   . Attends Banker Meetings:   Marland Kitchen Marital Status:   Intimate Partner Violence:   . Fear of Current or Ex-Partner:   . Emotionally Abused:   Marland Kitchen Physically Abused:   . Sexually Abused:      BP (!) 150/92   Pulse 77   Ht 5\' 9"  (1.753 m)   Wt 192 lb (87.1 kg)   SpO2 97%   BMI 28.35 kg/m   Physical Exam:  Well appearing NAD HEENT: Unremarkable Neck:  No JVD, no thyromegally Lymphatics:  No adenopathy Back:  No CVA tenderness Lungs:  Clear with  no wheezes HEART:  Regular rate rhythm, no murmurs, no rubs, no clicks Abd:  soft, positive bowel sounds, no organomegally, no rebound, no guarding Ext:  2 plus pulses, no edema, no cyanosis, no clubbing Skin:  No rashes no nodules Neuro:  CN II through XII intact, motor grossly intact  EKG - NSR  Assess/Plan: 1. SVT - she is s/p ablation and appears to be doing well. She will undergo watchful waiting.   .D.

## 2020-03-14 ENCOUNTER — Telehealth: Payer: Self-pay | Admitting: Orthopaedic Surgery

## 2020-03-14 NOTE — Telephone Encounter (Signed)
I put this in [place] outbox for you...was not sure if it had left yet to go back to [employer]?

## 2020-03-14 NOTE — Telephone Encounter (Signed)
Patient stopped by  Paperwork was sent over by [employer] on her behalf to be filled out by Dr.Xu. She wanted to get an update on it.   Call back: [phone]

## 2020-03-15 ENCOUNTER — Telehealth: Payer: Self-pay

## 2020-03-15 NOTE — Telephone Encounter (Signed)
Ic, lmvm advised Dr. Roda Shutters just signed forms and we have faxed to [facility]

## 2020-03-15 NOTE — Telephone Encounter (Signed)
Marchelle Folks with [employer] called concerning patient's FMLA paperwork.  Stated that the paperwork is needed by the end of the day today.  CB# [phone].  Please advise.  Thank you.

## 2020-03-15 NOTE — Telephone Encounter (Signed)
faxed

## 2020-03-23 ENCOUNTER — Ambulatory Visit
Admission: RE | Admit: 2020-03-23 | Discharge: 2020-03-23 | Disposition: A | Discharge status: Home / Self Care | Payer: 59 | Source: Ambulatory Visit | Attending: Orthopaedic Surgery | Admitting: Orthopaedic Surgery

## 2020-03-23 DIAGNOSIS — M47816 Spondylosis without myelopathy or radiculopathy, lumbar region: Secondary | ICD-10-CM | POA: Diagnosis not present

## 2020-03-23 DIAGNOSIS — G8929 Other chronic pain: Secondary | ICD-10-CM

## 2020-03-23 DIAGNOSIS — M25551 Pain in right hip: Secondary | ICD-10-CM | POA: Diagnosis not present

## 2020-03-23 DIAGNOSIS — M48061 Spinal stenosis, lumbar region without neurogenic claudication: Secondary | ICD-10-CM | POA: Diagnosis not present

## 2020-03-23 DIAGNOSIS — M5126 Other intervertebral disc displacement, lumbar region: Secondary | ICD-10-CM | POA: Diagnosis not present

## 2020-03-27 ENCOUNTER — Encounter: Payer: Self-pay | Admitting: Orthopaedic Surgery

## 2020-03-27 ENCOUNTER — Ambulatory Visit: Payer: 59 | Admitting: Orthopaedic Surgery

## 2020-03-27 DIAGNOSIS — G8929 Other chronic pain: Secondary | ICD-10-CM

## 2020-03-27 DIAGNOSIS — M5416 Radiculopathy, lumbar region: Secondary | ICD-10-CM | POA: Diagnosis not present

## 2020-03-27 DIAGNOSIS — M545 Low back pain: Secondary | ICD-10-CM | POA: Diagnosis not present

## 2020-03-27 NOTE — Progress Notes (Signed)
Office Visit Note   Patient: Traci Brown           Date of Birth: 22-May-1956           MRN: [id number] Visit Date: 03/27/2020              Requested by: Maurice Small, MD [address] [place],  [place] [address] PCP: Maurice Small, MD   Assessment & Plan: Visit Diagnoses:  1. Chronic bilateral low back pain without sciatica   2. Radiculopathy, lumbar region     Plan: Impression is chronic right-sided lumbar pain and occasional right lower extremity radiculopathy.  Recent MRI findings show multilevel disc and facet degeneration as well as progression of left-sided disc protrusion at L3-4 causing moderate subarticular and foraminal stenosis.  At this point, the patient is unsure whether she wants to proceed with ESI's she has had one in the past which only helped for 2 weeks.  She would like to be referred to Dr. Alvester Morin to further discuss options to include ESI a different level.  She will follow-up with [place] as needed.   Follow-Up Instructions: Return if symptoms worsen or fail to improve.   Orders:  No orders of the defined types were placed in this encounter.  No orders of the defined types were placed in this encounter.     Procedures: No procedures performed   Clinical Data: No additional findings.   Subjective: Chief Complaint  Patient presents with  . Lower Back - Pain    HPI patient is a pleasant 64 year old female who comes in today to discuss MRI results of her lumbar spine.  The pain initially started on February 1 of this year after slipping on ice falling on the right lateral hip.  Since then she has had increased pain to the right lower back and radiculopathy of the right lower extremity.  She has tried conservative treatment for the past several months which have failed to improve her symptoms.  Subsequent MRI of the lumbar spine obtained which shows multilevel disc and facet degeneration as well as progression of left-sided disc  protrusion at L3-4 causing moderate subarticular and foraminal stenosis.  Review of Systems as detailed in HPI.  All others reviewed and are negative.   Objective: Vital Signs: There were no vitals taken for this visit.  Physical Exam well-developed and well-nourished female no acute distress.  Alert and oriented x3.  Ortho Exam stable lumbar exam  Specialty Comments:  No specialty comments available.  Imaging: No new imaging   PMFS History: Patient Active Problem List   Diagnosis Date Noted  . Palpitations 11/12/2019  . Educated about COVID-19 virus infection 10/05/2019  . Chest pain 11/13/2018  . Preventative health care 06/18/2015  . Paroxysmal supraventricular tachycardia (HCC) 06/18/2015  . Abdominal pain, epigastric 06/18/2015  . Leukopenia 10/16/2014  . Rhabdomyolysis 10/09/2014  . Anemia 05/02/2014  . Thoracic spine pain 03/01/2014  . Hypertension 07/08/2012  . CKD (chronic kidney disease) stage 3, GFR 30-59 ml/min 07/08/2012  . HLD (hyperlipidemia) 12/15/2011  . Leg pain 04/15/2011   Past Medical History:  Diagnosis Date  . Abdominal pain, epigastric 06/18/2015  . Anemia 05/02/2014  . CKD (chronic kidney disease) stage 3, GFR 30-59 ml/min 07/08/2012  . Elevated LFTs   . HLD (hyperlipidemia) 12/15/2011  . Hypernatremia 07/22/2012  . Hypertension 07/08/2012  . Leukopenia 10/16/2014  . Paroxysmal supraventricular tachycardia (HCC) 06/18/2015  . Rhabdomyolysis 10/09/2014  . Thoracic spine pain 03/01/2014  .  Vaginal atrophy     Family History  Problem Relation Age of Onset  . Hypertension Mother   . Other Mother        WAR VICTIM IN [place]  . Other Father        WAR VICTIM IN [place]  . Heart disease Neg Hx     Past Surgical History:  Procedure Laterality Date  . BACK SURGERY    . CESAREAN SECTION  2010  . CHOLECYSTECTOMY    . LACERATION REPAIR Right 2010   ON HANDS AND FACE  . SVT ABLATION N/A 02/06/2020   Procedure: SVT ABLATION;  Surgeon: Marinus Maw, MD;  Location: [facility] INVASIVE CV LAB;  Service: Cardiovascular;  Laterality: N/A;  . womb tied (in [place] exact procedure unknown)     Social History   Occupational History  . Occupation: Oncologist: [employer]  Tobacco Use  . Smoking status: Never Smoker  . Smokeless tobacco: Never Used  Substance and Sexual Activity  . Alcohol use: No  . Drug use: No  . Sexual activity: Yes

## 2020-04-04 ENCOUNTER — Telehealth: Payer: Self-pay

## 2020-04-04 NOTE — Telephone Encounter (Signed)
FYI. I did cancel this patient's appointment as she requested and closed the current referral.

## 2020-04-04 NOTE — Telephone Encounter (Signed)
Patient called in wanting to cancel appt due to no longer wanting injection

## 2020-04-09 ENCOUNTER — Ambulatory Visit: Payer: 59 | Admitting: Physical Medicine and Rehabilitation

## 2020-07-02 ENCOUNTER — Emergency Department (HOSPITAL_COMMUNITY): Payer: PRIVATE HEALTH INSURANCE

## 2020-07-02 ENCOUNTER — Emergency Department (HOSPITAL_COMMUNITY)
Admission: EM | Admit: 2020-07-02 | Discharge: 2020-07-02 | Disposition: A | Discharge status: Home / Self Care | Payer: PRIVATE HEALTH INSURANCE | Attending: Emergency Medicine | Admitting: Emergency Medicine

## 2020-07-02 ENCOUNTER — Encounter (HOSPITAL_COMMUNITY): Payer: Self-pay | Admitting: Emergency Medicine

## 2020-07-02 DIAGNOSIS — I129 Hypertensive chronic kidney disease with stage 1 through stage 4 chronic kidney disease, or unspecified chronic kidney disease: Secondary | ICD-10-CM | POA: Insufficient documentation

## 2020-07-02 DIAGNOSIS — W06XXXA Fall from bed, initial encounter: Secondary | ICD-10-CM | POA: Diagnosis not present

## 2020-07-02 DIAGNOSIS — S99921A Unspecified injury of right foot, initial encounter: Secondary | ICD-10-CM | POA: Diagnosis not present

## 2020-07-02 DIAGNOSIS — N183 Chronic kidney disease, stage 3 unspecified: Secondary | ICD-10-CM | POA: Diagnosis not present

## 2020-07-02 DIAGNOSIS — T1490XA Injury, unspecified, initial encounter: Secondary | ICD-10-CM

## 2020-07-02 MED ORDER — ACETAMINOPHEN 325 MG PO TABS: 650.0000 mg | ORAL_TABLET | Freq: Once | ORAL | Status: DC

## 2020-07-02 NOTE — ED Provider Notes (Signed)
[facility] EMERGENCY DEPARTMENT Provider Note   CSN: [id number] Arrival date & time: 07/02/20  0744     History Chief Complaint  Patient presents with  . Foot Injury    Traci Brown is a 65 y.o. female with past medical history significant for anemia, CKD stage III, hypertension, paroxysmal supraventricular tachycardia.  HPI Patient presents to emergency department today with chief complaint of right foot pain x1 day.  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  She was helping transport a patient yesterday and unfortunately had her right foot run over by the bariatric bed.  She admits to sudden onset of mild intermittent pain in her right foot.  Pain is in her ankle and radiates to her big toe.  She rates the pain 5 out of 10 in severity. Pain is worse with walking. She did not take any medications for symptoms prior to arrival.  She is able to walk bear weight on right foot without difficulty.  She did apply ice which with her pain and swelling last night.  Denies any fever, chills, weakness, numbness, tingling or decrease sensation in right leg.    Past Medical History:  Diagnosis Date  . Abdominal pain, epigastric 06/18/2015  . Anemia 05/02/2014  . CKD (chronic kidney disease) stage 3, GFR 30-59 ml/min (HCC) 07/08/2012  . Elevated LFTs   . HLD (hyperlipidemia) 12/15/2011  . Hypernatremia 07/22/2012  . Hypertension 07/08/2012  . Leukopenia 10/16/2014  . Paroxysmal supraventricular tachycardia (HCC) 06/18/2015  . Rhabdomyolysis 10/09/2014  . Thoracic spine pain 03/01/2014  . Vaginal atrophy     Patient Active Problem List   Diagnosis Date Noted  . Palpitations 11/12/2019  . Educated about COVID-19 virus infection 10/05/2019  . Chest pain 11/13/2018  . Preventative health care 06/18/2015  . Paroxysmal supraventricular tachycardia (HCC) 06/18/2015  . Abdominal pain, epigastric 06/18/2015  . Leukopenia 10/16/2014  .  Rhabdomyolysis 10/09/2014  . Anemia 05/02/2014  . Thoracic spine pain 03/01/2014  . Hypertension 07/08/2012  . CKD (chronic kidney disease) stage 3, GFR 30-59 ml/min (HCC) 07/08/2012  . HLD (hyperlipidemia) 12/15/2011  . Leg pain 04/15/2011    Past Surgical History:  Procedure Laterality Date  . BACK SURGERY    . CESAREAN SECTION  2010  . CHOLECYSTECTOMY    . LACERATION REPAIR Right 2010   ON HANDS AND FACE  . SVT ABLATION N/A 02/06/2020   Procedure: SVT ABLATION;  Surgeon: Marinus Maw, MD;  Location: [facility] INVASIVE CV LAB;  Service: Cardiovascular;  Laterality: N/A;  . womb tied (in [place] exact procedure unknown)       OB History   No obstetric history on file.     Family History  Problem Relation Age of Onset  . Hypertension Mother   . Other Mother        WAR VICTIM IN [place]  . Other Father        WAR VICTIM IN [place]  . Heart disease Neg Hx     Social History   Tobacco Use  . Smoking status: Never Smoker  . Smokeless tobacco: Never Used  Substance Use Topics  . Alcohol use: No  . Drug use: No    Home Medications Prior to Admission medications   Medication Sig Start Date End Date Taking? Authorizing Provider  acetaminophen (TYLENOL) 500 MG tablet Take 1 tablet (500 mg total) by mouth every 6 (six) hours as needed for moderate pain. 10/09/19   Chilton Si,  Sharion Settler, PA-C  amLODipine (NORVASC) 5 MG tablet Take 1 tablet (5 mg total) by mouth daily. 03/08/20 06/06/20  Marinus Maw, MD  atorvastatin (LIPITOR) 40 MG tablet  10/31/19   [provider]  POLY-IRON 150 150 MG capsule  10/30/19   [provider]  traMADol (ULTRAM) 50 MG tablet Take 1-2 tablets (50-100 mg total) by mouth daily as needed. 02/15/20   Tarry Kos, MD    Allergies    Patient has no known allergies.  Review of Systems   Review of Systems All other systems are reviewed and are negative for acute change except as noted in the HPI.  Physical Exam Updated Vital Signs BP (!)  148/95 (BP Location: Right Arm)   Pulse 81   Temp 98 F (36.7 C) (Oral)   Resp 18   Ht 5\' 6"  (1.676 m)   Wt 90.7 kg   SpO2 98%   BMI 32.28 kg/m   Physical Exam Vitals and nursing note reviewed.  Constitutional:      Appearance: She is well-developed. She is not ill-appearing or toxic-appearing.  HENT:     Head: Normocephalic and atraumatic.     Nose: Nose normal.  Eyes:     General: No scleral icterus.       Right eye: No discharge.        Left eye: No discharge.     Conjunctiva/sclera: Conjunctivae normal.  Neck:     Vascular: No JVD.  Cardiovascular:     Rate and Rhythm: Normal rate and regular rhythm.     Pulses: Normal pulses.          Dorsalis pedis pulses are 2+ on the right side and 2+ on the left side.     Heart sounds: Normal heart sounds.  Pulmonary:     Effort: Pulmonary effort is normal.     Breath sounds: Normal breath sounds.  Abdominal:     General: There is no distension.  Musculoskeletal:        General: Normal range of motion.     Cervical back: Normal range of motion.     Comments: There is no swelling and tenderness over the lateral malleolus.No overt deformity. No tenderness over the medial aspect of the ankle. The fifth metatarsal is not tender. The ankle joint is intact without excessive opening on stressing. No break in skin. Good pedal pulse and cap refill of all toes. Wiggling toes without difficulty.   Compartments in right lower extremity are soft.  Skin:    General: Skin is warm and dry.  Neurological:     Mental Status: She is oriented to person, place, and time.     GCS: GCS eye subscore is 4. GCS verbal subscore is 5. GCS motor subscore is 6.     Comments: Fluent speech, no facial droop.  Psychiatric:        Behavior: Behavior normal.     ED Results / Procedures / Treatments   Labs (all labs ordered are listed, but only abnormal results are displayed) Labs Reviewed - No data to display  EKG None  Radiology DG Ankle Complete  Right  Result Date: 07/02/2020 CLINICAL DATA:  Pain after compressive type injury EXAM: RIGHT ANKLE - COMPLETE 3+ VIEW COMPARISON:  None. FINDINGS: Frontal, oblique, and lateral views were obtained. No evident fracture or joint effusion. No appreciable joint space narrowing or erosion. There is mild spurring in the dorsal midfoot. There is a small inferior calcaneal spur. Ankle  mortise appears intact. IMPRESSION: No fracture. Areas of mild osteoarthritic change in the dorsal midfoot. Small inferior calcaneal spur. Ankle mortise appears intact. Electronically Signed   By: Bretta Bang III M.D.   On: 07/02/2020 08:36   DG Foot Complete Right  Result Date: 07/02/2020 CLINICAL DATA:  RIGHT foot run over by a bariatric bed transporting a patient EXAM: RIGHT FOOT COMPLETE - 3+ VIEW COMPARISON:  None FINDINGS: Slight osseous demineralization. Joint spaces preserved. Soft tissue swelling at the region of the fifth MTP joint. No fracture, dislocation, or bone destruction. Small plantar calcaneal spur. IMPRESSION: No acute osseous abnormalities. Electronically Signed   By: Ulyses Southward M.D.   On: 07/02/2020 08:34    Procedures Procedures (including critical care time)  Medications Ordered in ED Medications  acetaminophen (TYLENOL) tablet 650 mg (has no administration in time range)    ED Course  I have reviewed the triage vital signs and the nursing notes.  Pertinent labs & imaging results that were available during my care of the patient were reviewed by me and considered in my medical decision making (see chart for details).    MDM Rules/Calculators/A&P                          History provided by patient with additional history obtained from chart review.    Patient presents to the ED with complaints of pain to the right foot s/p injury run over by bariatric hospital bed.  She was able to immediately free her foot from the wheel, there is no prolonged crush injury. Exam without obvious  deformity or open wounds. ROM intact.  Tenderness to palpation on exam.. NVI distally. Xray negative for fracture/dislocation. Therapeutic splint provided. PRICE and Tylenol recommended. I discussed results, treatment plan, need for follow-up, and return precautions with the patient. Provided opportunity for questions, patient confirmed understanding and are in agreement with plan.     Portions of this note were generated with Scientist, clinical (histocompatibility and immunogenetics). Dictation errors may occur despite best attempts at proofreading.   Final Clinical Impression(s) / ED Diagnoses Final diagnoses:  Injury  Injury of right foot, initial encounter    Rx / DC Orders ED Discharge Orders    None       Kathyrn Lass 07/02/20 0913    Eber Hong, MD 07/04/20 [phone]

## 2020-07-02 NOTE — Progress Notes (Signed)
Orthopedic Tech Progress Note Patient Details:  Traci Brown 1955/09/09 [id number]  Ortho Devices Type of Ortho Device: Ace wrap Ortho Device/Splint Location: RLE Ortho Device/Splint Interventions: Ordered, Application   Post Interventions Patient Tolerated: Well Instructions Provided: Care of device   Donald Pore 07/02/2020, 9:14 AM

## 2020-07-02 NOTE — ED Triage Notes (Signed)
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 Pt reports icing it last night.

## 2020-07-02 NOTE — Discharge Instructions (Signed)
The xray did not show any broken bones.  You will need to wear the ace wrap, elevate your foot and apply ice to help with pain and swelling. You can take tylenol as directed on the bottle for pain.  Follow up with primary care provider if you continue to have pain after 1 week

## 2020-10-28 ENCOUNTER — Other Ambulatory Visit (HOSPITAL_COMMUNITY): Payer: Self-pay | Admitting: Family Medicine

## 2020-10-28 DIAGNOSIS — E785 Hyperlipidemia, unspecified: Secondary | ICD-10-CM | POA: Diagnosis not present

## 2020-10-28 DIAGNOSIS — D649 Anemia, unspecified: Secondary | ICD-10-CM | POA: Diagnosis not present

## 2020-10-28 DIAGNOSIS — N183 Chronic kidney disease, stage 3 unspecified: Secondary | ICD-10-CM | POA: Diagnosis not present

## 2020-10-28 DIAGNOSIS — Z0000 Encounter for general adult medical examination without abnormal findings: Secondary | ICD-10-CM | POA: Diagnosis not present

## 2020-10-28 DIAGNOSIS — M25569 Pain in unspecified knee: Secondary | ICD-10-CM | POA: Diagnosis not present

## 2020-10-28 DIAGNOSIS — I471 Supraventricular tachycardia: Secondary | ICD-10-CM | POA: Diagnosis not present

## 2020-10-28 DIAGNOSIS — M545 Low back pain, unspecified: Secondary | ICD-10-CM | POA: Diagnosis not present

## 2020-10-28 DIAGNOSIS — I129 Hypertensive chronic kidney disease with stage 1 through stage 4 chronic kidney disease, or unspecified chronic kidney disease: Secondary | ICD-10-CM | POA: Diagnosis not present

## 2020-10-28 MED FILL — CYCLOBENZAPRINE HCL 5 MG TA: 5 | 90 days supply | Qty: 90 | Fill #0

## 2020-10-30 ENCOUNTER — Other Ambulatory Visit (HOSPITAL_COMMUNITY): Payer: Self-pay | Admitting: Family Medicine

## 2020-10-30 MED FILL — POLY-IRON 150 MG CAPSULE: 150 | 90 days supply | Qty: 90 | Fill #0

## 2020-11-01 ENCOUNTER — Encounter: Payer: Self-pay | Admitting: Internal Medicine

## 2020-11-05 ENCOUNTER — Ambulatory Visit: Payer: 59 | Admitting: Orthopaedic Surgery

## 2020-12-11 DIAGNOSIS — Z01419 Encounter for gynecological examination (general) (routine) without abnormal findings: Secondary | ICD-10-CM | POA: Diagnosis not present

## 2021-01-30 ENCOUNTER — Other Ambulatory Visit: Payer: Self-pay | Admitting: Family Medicine

## 2021-01-30 DIAGNOSIS — Z1231 Encounter for screening mammogram for malignant neoplasm of breast: Secondary | ICD-10-CM

## 2021-02-10 DIAGNOSIS — I129 Hypertensive chronic kidney disease with stage 1 through stage 4 chronic kidney disease, or unspecified chronic kidney disease: Secondary | ICD-10-CM | POA: Diagnosis not present

## 2021-02-10 DIAGNOSIS — D649 Anemia, unspecified: Secondary | ICD-10-CM | POA: Diagnosis not present

## 2021-02-13 ENCOUNTER — Other Ambulatory Visit (HOSPITAL_COMMUNITY): Payer: Self-pay

## 2021-02-13 MED ORDER — AMLODIPINE BESYLATE 5 MG PO TABS
5.0000 mg | ORAL_TABLET | Freq: Every day | ORAL | 1 refills | Status: DC
Start: 2021-02-13 — End: 2021-08-21
  Filled 2021-02-13: qty 30, 30d supply, fill #0
  Filled 2021-06-18: qty 30, 30d supply, fill #1

## 2021-03-11 DIAGNOSIS — I129 Hypertensive chronic kidney disease with stage 1 through stage 4 chronic kidney disease, or unspecified chronic kidney disease: Secondary | ICD-10-CM | POA: Diagnosis not present

## 2021-03-11 DIAGNOSIS — N183 Chronic kidney disease, stage 3 unspecified: Secondary | ICD-10-CM | POA: Diagnosis not present

## 2021-03-26 ENCOUNTER — Other Ambulatory Visit: Payer: Self-pay | Admitting: Nephrology

## 2021-03-26 DIAGNOSIS — N2581 Secondary hyperparathyroidism of renal origin: Secondary | ICD-10-CM | POA: Diagnosis not present

## 2021-03-26 DIAGNOSIS — Z791 Long term (current) use of non-steroidal anti-inflammatories (NSAID): Secondary | ICD-10-CM | POA: Diagnosis not present

## 2021-03-26 DIAGNOSIS — N1832 Chronic kidney disease, stage 3b: Secondary | ICD-10-CM

## 2021-03-26 DIAGNOSIS — N189 Chronic kidney disease, unspecified: Secondary | ICD-10-CM | POA: Diagnosis not present

## 2021-03-26 DIAGNOSIS — I129 Hypertensive chronic kidney disease with stage 1 through stage 4 chronic kidney disease, or unspecified chronic kidney disease: Secondary | ICD-10-CM | POA: Diagnosis not present

## 2021-03-26 DIAGNOSIS — D631 Anemia in chronic kidney disease: Secondary | ICD-10-CM | POA: Diagnosis not present

## 2021-03-31 ENCOUNTER — Other Ambulatory Visit: Payer: Self-pay

## 2021-03-31 ENCOUNTER — Ambulatory Visit
Admission: RE | Admit: 2021-03-31 | Discharge: 2021-03-31 | Disposition: A | Discharge status: Home / Self Care | Payer: 59 | Source: Ambulatory Visit | Attending: Family Medicine | Admitting: Family Medicine

## 2021-03-31 DIAGNOSIS — Z1231 Encounter for screening mammogram for malignant neoplasm of breast: Secondary | ICD-10-CM | POA: Diagnosis not present

## 2021-04-02 ENCOUNTER — Ambulatory Visit (HOSPITAL_BASED_OUTPATIENT_CLINIC_OR_DEPARTMENT_OTHER): Payer: 59 | Admitting: Internal Medicine

## 2021-04-02 ENCOUNTER — Other Ambulatory Visit (HOSPITAL_COMMUNITY): Payer: Self-pay

## 2021-04-02 ENCOUNTER — Encounter (HOSPITAL_BASED_OUTPATIENT_CLINIC_OR_DEPARTMENT_OTHER): Payer: Self-pay | Admitting: Internal Medicine

## 2021-04-02 ENCOUNTER — Other Ambulatory Visit: Payer: Self-pay

## 2021-04-02 VITALS — BP 148/82 | HR 76 | Ht 66.0 in | Wt 198.6 lb

## 2021-04-02 DIAGNOSIS — I471 Supraventricular tachycardia: Secondary | ICD-10-CM

## 2021-04-02 DIAGNOSIS — E7801 Familial hypercholesterolemia: Secondary | ICD-10-CM

## 2021-04-02 DIAGNOSIS — Z789 Other specified health status: Secondary | ICD-10-CM

## 2021-04-02 MED ORDER — AMLODIPINE BESYLATE 5 MG PO TABS
5.0000 mg | ORAL_TABLET | Freq: Every day | ORAL | 2 refills | Status: DC
Start: 2021-04-02 — End: 2021-08-21
  Filled 2021-04-02: qty 90, 90d supply, fill #0

## 2021-04-02 MED ORDER — ROSUVASTATIN CALCIUM 20 MG PO TABS
20.0000 mg | ORAL_TABLET | Freq: Every day | ORAL | 3 refills | Status: DC
  Filled 2021-04-02: qty 90, 90d supply, fill #0
  Filled 2021-06-18: qty 90, 90d supply, fill #1

## 2021-04-02 MED ORDER — ATORVASTATIN CALCIUM 40 MG PO TABS
40.0000 mg | ORAL_TABLET | Freq: Every day | ORAL | 2 refills | Status: DC
Start: 2021-04-02 — End: 2021-08-21
  Filled 2021-04-02: qty 90, 90d supply, fill #0

## 2021-04-02 NOTE — Patient Instructions (Addendum)
Medication Instructions:  START rosuvastatin (crestor) 20mg  daily  *If you need a refill on your cardiac medications before your next appointment, please call your pharmacy*   Lab Work: FASTING lab work in about 3-4 months to check cholesterol  -- complete about 1 week before your next visit with Dr.   If you have labs (blood work) drawn today and your tests are completely normal, you will receive your results only by: MyChart Message (if you have MyChart) OR A paper copy in the mail If you have any lab test that is abnormal or we need to change your treatment, we will call you to review the results.   Testing/Procedures: NONE   Follow-Up: At [facility], you and your health needs are our priority.  As part of our continuing mission to provide you with exceptional heart care, we have created designated Provider Care Teams.  These Care Teams include your primary Cardiologist (physician) and Advanced Practice Providers (APPs -  Physician Assistants and Nurse Practitioners) who all work together to provide you with the care you need, when you need it.  We recommend signing up for the patient portal called "MyChart".  Sign up information is provided on this After Visit Summary.  MyChart is used to connect with patients for Virtual Visits (Telemedicine).  Patients are able to view lab/test results, encounter notes, upcoming appointments, etc.  Non-urgent messages can be sent to your provider as well.   To learn more about what you can do with MyChart, go to [facility].    Your next appointment:   3-4 month(s) - lipid clinic  The format for your next appointment:   In Person  Provider:   [email or web address] Hilty, MD   Other Instructions

## 2021-04-02 NOTE — Progress Notes (Signed)
LIPID CLINIC CONSULT NOTE  Chief Complaint:  Dyslipidemia  Primary Care Physician: Maurice Small, MD  Primary Cardiologist:  None  HPI:  Traci Brown is a 65 y.o. female who is being seen today for the evaluation of dyslipidemia at the request of Maurice Small, MD. this is a pleasant 65 year old female with a history of very high cholesterol.  She is followed by Dr. Antoine Poche for cardiovascular risk as well as [name] for SVT and is status post recent ablation in June 2021.  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  Prior to that she fled [place] during conflict at which time unfortunately her parents and her husband were killed.  She says she has 4 daughters all of which have grown and the last is in college.  She has a longstanding history of dyslipidemia with markedly elevated cholesterol.  In February of this year total cholesterol was 431, HDL 40, triglycerides 354 and LDL 306.  Findings are very suggestive of familial combined hyperlipidemia or possibly a combination of FH/dysbetalipoproteinemia.  She is had some success in lowering her cholesterol in the past, I suspect on therapy, however currently she is not taking any medication.  She reported she had what sounds like reflux symptoms on atorvastatin.  PMHx:  Past Medical History:  Diagnosis Date   Abdominal pain, epigastric 06/18/2015   Anemia 05/02/2014   CKD (chronic kidney disease) stage 3, GFR 30-59 ml/min (HCC) 07/08/2012   Elevated LFTs    HLD (hyperlipidemia) 12/15/2011   Hypernatremia 07/22/2012   Hypertension 07/08/2012   Leukopenia 10/16/2014   Paroxysmal supraventricular tachycardia (HCC) 06/18/2015   Rhabdomyolysis 10/09/2014   Thoracic spine pain 03/01/2014   Vaginal atrophy     Past Surgical History:  Procedure Laterality Date   BACK SURGERY     CESAREAN SECTION  2010   CHOLECYSTECTOMY     LACERATION REPAIR Right 2010   ON HANDS  AND FACE   SVT ABLATION N/A 02/06/2020   Procedure: SVT ABLATION;  Surgeon: Marinus Maw, MD;  Location: [facility];  Service: Cardiovascular;  Laterality: N/A;   womb tied (in [place] exact procedure unknown)      FAMHx:  Family History  Problem Relation Age of Onset   Hypertension Mother    Other Mother        WAR VICTIM IN [place]   Other Father        WAR VICTIM IN [place]   Heart disease Neg Hx     SOCHx:   reports that she has never smoked. She has never used smokeless tobacco. She reports that she does not drink alcohol and does not use drugs.  ALLERGIES:  No Known Allergies  ROS: Pertinent items noted in HPI and remainder of comprehensive ROS otherwise negative.  HOME MEDS: Current Outpatient Medications on File Prior to Visit  Medication Sig Dispense Refill   acetaminophen (TYLENOL) 500 MG tablet Take 1 tablet (500 mg total) by mouth every 6 (six) hours as needed for moderate pain. 30 tablet 0   amLODipine (NORVASC) 5 MG tablet Take 1 tablet (5 mg total) by mouth daily. 30 tablet 1   atorvastatin (LIPITOR) 40 MG tablet      iron polysaccharides (NIFEREX) 150 MG capsule TAKE 1 CAPSULE BY MOUTH ONCE DAILY. 90 capsule 1   amLODipine (NORVASC) 5 MG tablet Take 1 tablet (5 mg total) by mouth daily. (Patient not  taking: Reported on 04/02/2021) 90 tablet 3   cyclobenzaprine (FLEXERIL) 5 MG tablet TAKE 1 TABLET BY MOUTH ONCE A DAY AT BEDTIME AS NEEDED (Patient not taking: Reported on 04/02/2021) 90 tablet 3   POLY-IRON 150 150 MG capsule  (Patient not taking: Reported on 04/02/2021)     traMADol (ULTRAM) 50 MG tablet Take 1-2 tablets (50-100 mg total) by mouth daily as needed. (Patient not taking: Reported on 04/02/2021) 20 tablet 0   Current Facility-Administered Medications on File Prior to Visit  Medication Dose Route Frequency Provider Last Rate Last Admin   betamethasone acetate-betamethasone sodium phosphate (CELESTONE) injection 3 mg  3 mg Intramuscular Once Felecia Shelling, DPM        LABS/IMAGING: No results found for this or any previous visit (from the past 48 hour(s)). No results found.  LIPID PANEL:    Component Value Date/Time   CHOL 422 (H) 06/18/2015 1601   TRIG 438 (H) 06/18/2015 1601   HDL 32 (L) 06/18/2015 1601   CHOLHDL 13.2 (H) 06/18/2015 1601   CHOLHDL 11.9 10/09/2014 1630   VLDL NOT CALC 10/09/2014 1630   LDLCALC Comment 06/18/2015 1601   LDLDIRECT 187 (H) 10/11/2014 0240    WEIGHTS: Wt Readings from Last 3 Encounters:  04/02/21 198 lb 9.6 oz (90.1 kg)  07/02/20 200 lb (90.7 kg)  03/08/20 192 lb (87.1 kg)    VITALS: BP (!) 148/82   Pulse 76   Ht 5\' 6"  (1.676 m)   Wt 198 lb 9.6 oz (90.1 kg)   SpO2 98%   BMI 32.05 kg/m   EXAM: General appearance: alert and no distress Neck: no carotid bruit, no JVD, and thyroid not enlarged, symmetric, no tenderness/mass/nodules Lungs: clear to auscultation bilaterally Heart: regular rate and rhythm, S1, S2 normal, no murmur, click, rub or gallop Abdomen: soft, non-tender; bowel sounds normal; no masses,  no organomegaly Extremities: extremities normal, atraumatic, no cyanosis or edema Pulses: 2+ and symmetric Skin: Skin color, texture, turgor normal. No rashes or lesions Neurologic: Grossly normal Pleasant  EKG: N/A  ASSESSMENT: Familial hyperlipidemia, possibly homozygous Unknown family history of dyslipidemia or coronary disease Statin intolerance History of SVT status post ablation  PLAN: 1.    [name] has marked dyslipidemia, possibly homozygous or compound heterozygous mutations.  We will go ahead and proceed with genetic testing to further try to determine this.  In the past she has been able to have some improvement in her lipids with LDL in the 180 range at 1 point.  She could not tolerate atorvastatin because of what sounds like reflux symptoms.  She is willing to try another statin and we will go ahead with Crestor 20 mg daily.  Ultimately she will likely  need a PCSK9 inhibitor.  Interestingly, she has not yet developed any coronary disease.  She has had stress testing which was negative and a CT angiogram of the chest in 2016 which was not remarkable for any coronary calcium.  Plan follow-up with repeat lipids and an LP(a) in about 3 months.  2017, MD, [facility], FACP  [place]  [facility]  Medical Director of the Advanced Lipid Disorders &  Cardiovascular Risk Reduction Clinic Diplomate of the American Board of Clinical Lipidology Attending Cardiologist  Direct Dial: [phone]  Fax: [phone]  Website:  [email or web address] Shatiqua Heroux 04/02/2021, 4:30 PM

## 2021-04-03 ENCOUNTER — Other Ambulatory Visit (HOSPITAL_COMMUNITY): Payer: Self-pay

## 2021-04-08 ENCOUNTER — Ambulatory Visit
Admission: RE | Admit: 2021-04-08 | Discharge: 2021-04-08 | Disposition: A | Discharge status: Home / Self Care | Payer: 59 | Source: Ambulatory Visit | Attending: Nephrology | Admitting: Nephrology

## 2021-04-08 DIAGNOSIS — N189 Chronic kidney disease, unspecified: Secondary | ICD-10-CM | POA: Diagnosis not present

## 2021-04-08 DIAGNOSIS — N1832 Chronic kidney disease, stage 3b: Secondary | ICD-10-CM

## 2021-04-25 ENCOUNTER — Ambulatory Visit (HOSPITAL_COMMUNITY)
Admission: RE | Admit: 2021-04-25 | Discharge: 2021-04-25 | Disposition: A | Discharge status: Home / Self Care | Payer: 59 | Source: Ambulatory Visit | Attending: Nephrology | Admitting: Nephrology

## 2021-04-25 ENCOUNTER — Other Ambulatory Visit: Payer: Self-pay

## 2021-04-25 VITALS — BP 124/57 | HR 86 | Temp 98.3°F | Resp 18

## 2021-04-25 DIAGNOSIS — N183 Chronic kidney disease, stage 3 unspecified: Secondary | ICD-10-CM | POA: Diagnosis not present

## 2021-04-25 LAB — POCT HEMOGLOBIN-HEMACUE: Hemoglobin: 8.5 g/dL — ABNORMAL LOW (ref 12.0–15.0)

## 2021-04-25 MED ORDER — EPOETIN ALFA-EPBX 10000 UNIT/ML IJ SOLN
INTRAMUSCULAR | Status: AC
  Administered 2021-04-25: 10000 [IU] via SUBCUTANEOUS
  Filled 2021-04-25: qty 1

## 2021-04-25 MED ORDER — EPOETIN ALFA-EPBX 10000 UNIT/ML IJ SOLN: 10000.0000 [IU] | INTRAMUSCULAR | Status: DC

## 2021-05-09 ENCOUNTER — Encounter (HOSPITAL_COMMUNITY)
Admission: RE | Admit: 2021-05-09 | Discharge: 2021-05-09 | Disposition: A | Discharge status: Home / Self Care | Payer: 59 | Source: Ambulatory Visit | Attending: Nephrology | Admitting: Nephrology

## 2021-05-09 ENCOUNTER — Other Ambulatory Visit: Payer: Self-pay

## 2021-05-09 VITALS — BP 130/78 | HR 75 | Temp 98.0°F | Resp 17

## 2021-05-09 DIAGNOSIS — N183 Chronic kidney disease, stage 3 unspecified: Secondary | ICD-10-CM | POA: Insufficient documentation

## 2021-05-09 LAB — POCT HEMOGLOBIN-HEMACUE: Hemoglobin: 8.2 g/dL — ABNORMAL LOW (ref 12.0–15.0)

## 2021-05-09 MED ORDER — EPOETIN ALFA-EPBX 10000 UNIT/ML IJ SOLN
10000.0000 [IU] | INTRAMUSCULAR | Status: DC
  Administered 2021-05-09: 10000 [IU] via SUBCUTANEOUS

## 2021-05-09 MED ORDER — EPOETIN ALFA-EPBX 10000 UNIT/ML IJ SOLN
INTRAMUSCULAR | Status: AC
  Filled 2021-05-09: qty 1

## 2021-05-13 ENCOUNTER — Telehealth: Payer: Self-pay | Admitting: Internal Medicine

## 2021-05-13 NOTE — Telephone Encounter (Signed)
Patient seen 04/02/21 - genetic test discussed. Patient stated at that time her best available days were Mon/Wed afternoons. Thus far, this has not worked with [facility] schedule. Left message for patient to call back to arrange test either tomorrow 05/14/21 in the PM or Monday 05/19/21 in the PM

## 2021-05-15 DIAGNOSIS — E7801 Familial hypercholesterolemia: Secondary | ICD-10-CM | POA: Diagnosis not present

## 2021-05-19 ENCOUNTER — Ambulatory Visit: Payer: 59 | Admitting: *Deleted

## 2021-05-19 ENCOUNTER — Other Ambulatory Visit: Payer: Self-pay

## 2021-05-19 DIAGNOSIS — E7801 Familial hypercholesterolemia: Secondary | ICD-10-CM

## 2021-05-19 NOTE — Progress Notes (Signed)
Patient presents to office for genetic test for FH. Buccal swab performed by Bea Laura, RN. Genetic test for FH will be submitted to [facility]. Results should be available in 3-4 weeks.

## 2021-05-23 ENCOUNTER — Encounter (HOSPITAL_COMMUNITY)
Admission: RE | Admit: 2021-05-23 | Discharge: 2021-05-23 | Disposition: A | Discharge status: Home / Self Care | Payer: 59 | Source: Ambulatory Visit | Attending: Nephrology | Admitting: Nephrology

## 2021-05-23 ENCOUNTER — Other Ambulatory Visit: Payer: Self-pay

## 2021-05-23 VITALS — BP 122/67 | HR 85 | Temp 98.6°F | Resp 18

## 2021-05-23 DIAGNOSIS — N183 Chronic kidney disease, stage 3 unspecified: Secondary | ICD-10-CM | POA: Diagnosis not present

## 2021-05-23 LAB — POCT HEMOGLOBIN-HEMACUE: Hemoglobin: 7.7 g/dL — ABNORMAL LOW (ref 12.0–15.0)

## 2021-05-23 LAB — IRON AND TIBC
Iron: 63 ug/dL (ref 28–170)
Saturation Ratios: 20 % (ref 10.4–31.8)
TIBC: 321 ug/dL (ref 250–450)
UIBC: 258 ug/dL

## 2021-05-23 LAB — FERRITIN: Ferritin: 251 ng/mL (ref 11–307)

## 2021-05-23 MED ORDER — EPOETIN ALFA-EPBX 10000 UNIT/ML IJ SOLN
10000.0000 [IU] | INTRAMUSCULAR | Status: DC
  Administered 2021-05-23: 10000 [IU] via SUBCUTANEOUS

## 2021-05-23 MED ORDER — EPOETIN ALFA-EPBX 10000 UNIT/ML IJ SOLN
INTRAMUSCULAR | Status: AC
  Filled 2021-05-23: qty 1

## 2021-05-23 NOTE — Progress Notes (Signed)
Pt here for retacrit injection. Hgb today 7.7 (previous appt it was 8.2) Pt denies bleeding, SOB, or chest pain but states is tired. Office called and spoke with [place] who instructed me to proceed with injection and she will let Dr Thedore Mins know. Iron studies drawn today as well (per previous orders)  Instructed pt to notify MD if any signs of bleeding or new symptoms.

## 2021-06-03 DIAGNOSIS — N189 Chronic kidney disease, unspecified: Secondary | ICD-10-CM | POA: Diagnosis not present

## 2021-06-05 ENCOUNTER — Other Ambulatory Visit (HOSPITAL_COMMUNITY): Payer: Self-pay

## 2021-06-05 MED ORDER — AZITHROMYCIN 500 MG PO TABS
ORAL_TABLET | ORAL | 0 refills | Status: DC
  Filled 2021-06-05: qty 4, 3d supply, fill #0

## 2021-06-05 MED ORDER — ATOVAQUONE-PROGUANIL HCL 250-100 MG PO TABS
1.0000 | ORAL_TABLET | Freq: Every day | ORAL | 0 refills | Status: DC
  Filled 2021-06-05: qty 33, 33d supply, fill #0

## 2021-06-06 ENCOUNTER — Encounter (HOSPITAL_COMMUNITY): Payer: 59

## 2021-06-10 ENCOUNTER — Other Ambulatory Visit: Payer: Self-pay

## 2021-06-10 ENCOUNTER — Ambulatory Visit (HOSPITAL_COMMUNITY)
Admission: RE | Admit: 2021-06-10 | Discharge: 2021-06-10 | Disposition: A | Discharge status: Home / Self Care | Payer: 59 | Source: Ambulatory Visit | Attending: Nephrology | Admitting: Nephrology

## 2021-06-10 VITALS — BP 136/74 | HR 80 | Temp 97.5°F | Resp 18

## 2021-06-10 DIAGNOSIS — N183 Chronic kidney disease, stage 3 unspecified: Secondary | ICD-10-CM | POA: Diagnosis not present

## 2021-06-10 LAB — POCT HEMOGLOBIN-HEMACUE: Hemoglobin: 9.1 g/dL — ABNORMAL LOW (ref 12.0–15.0)

## 2021-06-10 MED ORDER — EPOETIN ALFA-EPBX 10000 UNIT/ML IJ SOLN: 10000.0000 [IU] | INTRAMUSCULAR | Status: DC

## 2021-06-10 MED ORDER — EPOETIN ALFA-EPBX 10000 UNIT/ML IJ SOLN
INTRAMUSCULAR | Status: AC
  Administered 2021-06-10: 10000 [IU] via SUBCUTANEOUS
  Filled 2021-06-10: qty 1

## 2021-06-18 ENCOUNTER — Other Ambulatory Visit (HOSPITAL_COMMUNITY): Payer: Self-pay

## 2021-06-23 ENCOUNTER — Telehealth: Payer: Self-pay | Admitting: Internal Medicine

## 2021-06-23 NOTE — Telephone Encounter (Signed)
Patient returning MD call about genetic test results

## 2021-06-23 NOTE — Telephone Encounter (Signed)
I do not see a reason for a call to this patient. Routing to primary cardiologist, Dr. Blanchie Dessert nurse for awareness.

## 2021-06-23 NOTE — Telephone Encounter (Signed)
Patient said she got a call from our office but did not know why.  If the office needs to reach her please call back and leave a detailed message if she can not answer

## 2021-06-24 ENCOUNTER — Other Ambulatory Visit: Payer: Self-pay

## 2021-06-24 ENCOUNTER — Encounter (HOSPITAL_COMMUNITY)
Admission: RE | Admit: 2021-06-24 | Discharge: 2021-06-24 | Disposition: A | Discharge status: Home / Self Care | Payer: 59 | Source: Ambulatory Visit | Attending: Nephrology | Admitting: Nephrology

## 2021-06-24 VITALS — BP 127/83 | HR 77 | Temp 97.3°F | Resp 18

## 2021-06-24 DIAGNOSIS — N183 Chronic kidney disease, stage 3 unspecified: Secondary | ICD-10-CM | POA: Insufficient documentation

## 2021-06-24 LAB — IRON AND TIBC
Iron: 59 ug/dL (ref 28–170)
Saturation Ratios: 17 % (ref 10.4–31.8)
TIBC: 347 ug/dL (ref 250–450)
UIBC: 288 ug/dL

## 2021-06-24 LAB — FERRITIN: Ferritin: 230 ng/mL (ref 11–307)

## 2021-06-24 LAB — POCT HEMOGLOBIN-HEMACUE: Hemoglobin: 8.9 g/dL — ABNORMAL LOW (ref 12.0–15.0)

## 2021-06-24 MED ORDER — EPOETIN ALFA-EPBX 10000 UNIT/ML IJ SOLN
10000.0000 [IU] | INTRAMUSCULAR | Status: DC
  Administered 2021-06-24: 10000 [IU] via SUBCUTANEOUS

## 2021-06-24 MED ORDER — EPOETIN ALFA-EPBX 10000 UNIT/ML IJ SOLN
INTRAMUSCULAR | Status: AC
  Filled 2021-06-24: qty 1

## 2021-06-24 NOTE — Telephone Encounter (Signed)
Message Received: Today Hilty, Lisette Abu, MD  Lindell Spar, RN Caller: Unspecified (Yesterday, 11:47 AM) Jeanene Erb patient's home and cell phone- no answer. Did not specify owner of number, so no voicemail left.   Dr. Rexene Edison

## 2021-07-08 ENCOUNTER — Encounter (HOSPITAL_COMMUNITY)
Admission: RE | Admit: 2021-07-08 | Discharge: 2021-07-08 | Disposition: A | Discharge status: Home / Self Care | Payer: 59 | Source: Ambulatory Visit | Attending: Nephrology | Admitting: Nephrology

## 2021-07-08 ENCOUNTER — Other Ambulatory Visit: Payer: Self-pay

## 2021-07-08 VITALS — BP 123/83 | HR 77 | Temp 97.2°F | Resp 18

## 2021-07-08 DIAGNOSIS — N183 Chronic kidney disease, stage 3 unspecified: Secondary | ICD-10-CM | POA: Diagnosis not present

## 2021-07-08 LAB — POCT HEMOGLOBIN-HEMACUE: Hemoglobin: 9.3 g/dL — ABNORMAL LOW (ref 12.0–15.0)

## 2021-07-08 MED ORDER — EPOETIN ALFA-EPBX 10000 UNIT/ML IJ SOLN: 20000.0000 [IU] | Freq: Once | INTRAMUSCULAR | Status: AC

## 2021-07-08 MED ORDER — EPOETIN ALFA-EPBX 10000 UNIT/ML IJ SOLN: 10000.0000 [IU] | INTRAMUSCULAR | Status: DC

## 2021-07-08 MED ORDER — EPOETIN ALFA-EPBX 10000 UNIT/ML IJ SOLN
INTRAMUSCULAR | Status: AC
  Administered 2021-07-08: 20000 [IU] via SUBCUTANEOUS
  Filled 2021-07-08: qty 2

## 2021-07-08 NOTE — Progress Notes (Signed)
One time verbal order given for retacrit 20,000 units today per [facility] [place] [facility].

## 2021-07-08 NOTE — Progress Notes (Signed)
Hemocue today 9.3.  Pt states she is leaving next week to go  out of the country for a month.  Called and spoke to [facility] to see if Dr Thedore Mins wants to increase her dose of retacrit for today since she will be gone for a month.  Awaiting return call from MD

## 2021-07-10 NOTE — Telephone Encounter (Signed)
Hilty MD has tried to reach patient about genetic test results - has been unable to connect with her Genetic test results mailed to patient, per MD request, with note to call with questions

## 2021-07-22 ENCOUNTER — Encounter (HOSPITAL_COMMUNITY): Payer: 59

## 2021-08-21 ENCOUNTER — Ambulatory Visit (HOSPITAL_BASED_OUTPATIENT_CLINIC_OR_DEPARTMENT_OTHER): Payer: 59 | Admitting: Internal Medicine

## 2021-08-21 ENCOUNTER — Encounter (HOSPITAL_BASED_OUTPATIENT_CLINIC_OR_DEPARTMENT_OTHER): Payer: Self-pay | Admitting: Internal Medicine

## 2021-08-21 ENCOUNTER — Other Ambulatory Visit: Payer: Self-pay

## 2021-08-21 VITALS — BP 134/86 | HR 73 | Ht 68.0 in | Wt 193.8 lb

## 2021-08-21 DIAGNOSIS — Z789 Other specified health status: Secondary | ICD-10-CM | POA: Diagnosis not present

## 2021-08-21 DIAGNOSIS — E7801 Familial hypercholesterolemia: Secondary | ICD-10-CM | POA: Diagnosis not present

## 2021-08-21 NOTE — Progress Notes (Signed)
LIPID CLINIC CONSULT NOTE  Chief Complaint:  Follow-up dyslipidemia  Primary Care Physician: Maurice Small, Traci Brown  Primary Cardiologist:  Rollene Rotunda, Traci Brown  HPI:  Traci Brown is a 65 y.o. female who is being seen today for the evaluation of dyslipidemia at the request of Maurice Small, Traci Brown. this is a pleasant 65 year old female with a history of very high cholesterol.  She is followed by Dr. Antoine Poche for cardiovascular risk as well as [name] for SVT and is status post recent ablation in June 2021.  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  Prior to that she fled [place] during conflict at which time unfortunately her parents and her husband were killed.  She says she has 4 daughters all of which have grown and the last is in college.  She has a longstanding history of dyslipidemia with markedly elevated cholesterol.  In February of this year total cholesterol was 431, HDL 40, triglycerides 354 and LDL 306.  Findings are very suggestive of familial combined hyperlipidemia or possibly a combination of FH/dysbetalipoproteinemia.  She is had some success in lowering her cholesterol in the past, I suspect on therapy, however currently she is not taking any medication.  She reported she had what sounds like reflux symptoms on atorvastatin.  08/21/2021  Traci Brown returns today for follow-up.  She reports compliance on rosuvastatin 20 mg daily.  She did not however get lipid testing prior to this visit.  We did do genetic testing which showed no "pathologic "variant, however she had multiple variants of unknown significance.  This included mutations in APO E (E2/A3), LDL receptor, LPL, CETP, LIPG, 2 mutations in the LPA gene, and 2 mutations affecting obesity and 1 affecting endothelial dysfunction.  Based on this I would qualify her as a compound heterozygote and at significantly increased risk of heart  disease.  PMHx:  Past Medical History:  Diagnosis Date   Abdominal pain, epigastric 06/18/2015   Anemia 05/02/2014   CKD (chronic kidney disease) stage 3, GFR 30-59 ml/min (HCC) 07/08/2012   Elevated LFTs    HLD (hyperlipidemia) 12/15/2011   Hypernatremia 07/22/2012   Hypertension 07/08/2012   Leukopenia 10/16/2014   Paroxysmal supraventricular tachycardia (HCC) 06/18/2015   Rhabdomyolysis 10/09/2014   Thoracic spine pain 03/01/2014   Vaginal atrophy     Past Surgical History:  Procedure Laterality Date   BACK SURGERY     CESAREAN SECTION  2010   CHOLECYSTECTOMY     LACERATION REPAIR Right 2010   ON HANDS AND FACE   SVT ABLATION N/A 02/06/2020   Procedure: SVT ABLATION;  Surgeon: Marinus Maw, Traci Brown;  Location: [facility];  Service: Cardiovascular;  Laterality: N/A;   womb tied (in [place] exact procedure unknown)      FAMHx:  Family History  Problem Relation Age of Onset   Hypertension Mother    Other Mother        WAR VICTIM IN [place]   Other Father        WAR VICTIM IN [place]   Heart disease Neg Hx     SOCHx:   reports that she has never smoked. She has never used smokeless tobacco. She reports that she does not drink alcohol and does not use drugs.  ALLERGIES:  No Known Allergies  ROS: Pertinent items noted in HPI and remainder of comprehensive ROS otherwise negative.  HOME MEDS: Current Outpatient Medications on  File Prior to Visit  Medication Sig Dispense Refill   acetaminophen (TYLENOL) 500 MG tablet Take 1 tablet (500 mg total) by mouth every 6 (six) hours as needed for moderate pain. 30 tablet 0   amLODipine (NORVASC) 5 MG tablet Take 1 tablet (5 mg total) by mouth daily. 90 tablet 3   iron polysaccharides (NIFEREX) 150 MG capsule TAKE 1 CAPSULE BY MOUTH ONCE DAILY. 90 capsule 1   rosuvastatin (CRESTOR) 20 MG tablet Take 1 tablet (20 mg total) by mouth daily. 90 tablet 3   Current Facility-Administered Medications on File Prior to Visit  Medication Dose  Route Frequency Provider Last Rate Last Admin   betamethasone acetate-betamethasone sodium phosphate (CELESTONE) injection 3 mg  3 mg Intramuscular Once Edrick Kins, DPM        LABS/IMAGING: No results found for this or any previous visit (from the past 48 hour(s)). No results found.  LIPID PANEL:    Component Value Date/Time   CHOL 422 (H) 06/18/2015 1601   TRIG 438 (H) 06/18/2015 1601   HDL 32 (L) 06/18/2015 1601   CHOLHDL 13.2 (H) 06/18/2015 1601   CHOLHDL 11.9 10/09/2014 1630   VLDL NOT CALC 10/09/2014 1630   LDLCALC Comment 06/18/2015 1601   LDLDIRECT 187 (H) 10/11/2014 0240    WEIGHTS: Wt Readings from Last 3 Encounters:  08/21/21 193 lb 12.8 oz (87.9 kg)  04/02/21 198 lb 9.6 oz (90.1 kg)  07/02/20 200 lb (90.7 kg)    VITALS: BP 134/86    Pulse 73    Ht 5\' 8"  (1.727 m)    Wt 193 lb 12.8 oz (87.9 kg)    SpO2 97%    BMI 29.47 kg/m   EXAM: General appearance: alert and no distress Neck: no carotid bruit, no JVD, and thyroid not enlarged, symmetric, no tenderness/mass/nodules Lungs: clear to auscultation bilaterally Heart: regular rate and rhythm, S1, S2 normal, no murmur, click, rub or gallop Abdomen: soft, non-tender; bowel sounds normal; no masses,  no organomegaly Extremities: extremities normal, atraumatic, no cyanosis or edema Pulses: 2+ and symmetric Skin: Skin color, texture, turgor normal. 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    Traci Brown has been tolerating rosuvastatin but did not have her lipids repeated.  She has multiple genetic mutations and is considered FH based on having compound heterozygosity.  Hopefully she will have a good response to  rosuvastatin and we will plan to get her lipids tested today.  She will most likely need additional therapy, I suspect a PCSK9 inhibitor.  Also, she had mutations in the LP(a) gene and will test for that as well as I suspect she has a high LP(a).  PCSK9 inhibitor may help with this as well.  Follow-up with me in 3 to 4 months.  Traci Casino, Traci Brown, [facility], [place] Director of the Advanced Lipid Disorders &  Cardiovascular Risk Reduction Clinic Diplomate of the American Board of Clinical Lipidology Attending Cardiologist  Direct Dial: [phone]   Fax: [phone]  Website:  [email or web address] 08/21/2021, 4:19 PM

## 2021-08-21 NOTE — Patient Instructions (Signed)
Medication Instructions:  Your Physician recommend you continue on your current medication as directed.    *If you need a refill on your cardiac medications before your next appointment, please call your pharmacy*   Lab Work: Please get your labs done today on the 3rd Floor  Fasting Labs in about 4 months before your next visit- Lab slips will be mailed to you   If you have labs (blood work) drawn today and your tests are completely normal, you will receive your results only by: MyChart Message (if you have MyChart) OR A paper copy in the mail If you have any lab test that is abnormal or we need to change your treatment, we will call you to review the results.   Testing/Procedures: None today    Follow-Up: At [facility], you and your health needs are our priority.  As part of our continuing mission to provide you with exceptional heart care, we have created designated Provider Care Teams.  These Care Teams include your primary Cardiologist (physician) and Advanced Practice Providers (APPs -  Physician Assistants and Nurse Practitioners) who all work together to provide you with the care you need, when you need it.  We recommend signing up for the patient portal called "MyChart".  Sign up information is provided on this After Visit Summary.  MyChart is used to connect with patients for Virtual Visits (Telemedicine).  Patients are able to view lab/test results, encounter notes, upcoming appointments, etc.  Non-urgent messages can be sent to your provider as well.   To learn more about what you can do with MyChart, go to [email or web address].    Your next appointment:   4 month(s)  The format for your next appointment:   In Person  Provider: Dr. Rennis Golden     Other Instructions We will update you after lab work.

## 2021-08-22 ENCOUNTER — Encounter (HOSPITAL_COMMUNITY): Payer: 59

## 2021-08-22 LAB — LIPOPROTEIN A (LPA): Lipoprotein (a): 66 nmol/L (ref <75.0)

## 2021-08-22 LAB — NMR, LIPOPROFILE
Cholesterol, Total: 207 mg/dL — ABNORMAL HIGH (ref 100–199)
HDL Particle Number: 30.1 umol/L — ABNORMAL LOW
HDL-C: 42 mg/dL
LDL Particle Number: 1817 nmol/L — ABNORMAL HIGH
LDL Size: 19.9 nm — ABNORMAL LOW
LDL-C (NIH Calc): 135 mg/dL — ABNORMAL HIGH (ref 0–99)
LP-IR Score: 62 — ABNORMAL HIGH
Small LDL Particle Number: 1384 nmol/L — ABNORMAL HIGH
Triglycerides: 168 mg/dL — ABNORMAL HIGH (ref 0–149)

## 2021-08-25 ENCOUNTER — Encounter (HOSPITAL_COMMUNITY)
Admission: RE | Admit: 2021-08-25 | Discharge: 2021-08-25 | Disposition: A | Discharge status: Home / Self Care | Payer: 59 | Source: Ambulatory Visit | Attending: Nephrology | Admitting: Nephrology

## 2021-08-25 ENCOUNTER — Other Ambulatory Visit: Payer: Self-pay

## 2021-08-25 VITALS — BP 133/83 | HR 68 | Temp 97.2°F | Resp 18

## 2021-08-25 DIAGNOSIS — N183 Chronic kidney disease, stage 3 unspecified: Secondary | ICD-10-CM | POA: Insufficient documentation

## 2021-08-25 LAB — FERRITIN: Ferritin: 201 ng/mL (ref 11–307)

## 2021-08-25 LAB — IRON AND TIBC
Iron: 69 ug/dL (ref 28–170)
Saturation Ratios: 20 % (ref 10.4–31.8)
TIBC: 349 ug/dL (ref 250–450)
UIBC: 280 ug/dL

## 2021-08-25 LAB — POCT HEMOGLOBIN-HEMACUE: Hemoglobin: 9.9 g/dL — ABNORMAL LOW (ref 12.0–15.0)

## 2021-08-25 MED ORDER — EPOETIN ALFA-EPBX 10000 UNIT/ML IJ SOLN
INTRAMUSCULAR | Status: AC
  Administered 2021-08-25: 09:00:00 10000 [IU] via SUBCUTANEOUS
  Filled 2021-08-25: qty 1

## 2021-08-25 MED ORDER — EPOETIN ALFA-EPBX 10000 UNIT/ML IJ SOLN: 10000.0000 [IU] | INTRAMUSCULAR | Status: DC

## 2021-08-28 ENCOUNTER — Telehealth: Payer: Self-pay | Admitting: Internal Medicine

## 2021-08-28 NOTE — Telephone Encounter (Signed)
Called pt and let her know her labs are resulted but Dr. Rennis Golden has not reviewed them yet. Once he reviews them and gives his recommendations we will call her back. She verbalized understanding.

## 2021-08-28 NOTE — Telephone Encounter (Signed)
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

## 2021-09-03 ENCOUNTER — Other Ambulatory Visit (HOSPITAL_COMMUNITY): Payer: Self-pay

## 2021-09-03 ENCOUNTER — Other Ambulatory Visit: Payer: Self-pay

## 2021-09-03 MED ORDER — AMLODIPINE BESYLATE 5 MG PO TABS
5.0000 mg | ORAL_TABLET | Freq: Every day | ORAL | 0 refills | Status: DC
  Filled 2021-09-03: qty 90, 90d supply, fill #0

## 2021-09-05 ENCOUNTER — Encounter (HOSPITAL_COMMUNITY): Payer: 59

## 2021-09-10 ENCOUNTER — Encounter (HOSPITAL_COMMUNITY)
Admission: RE | Admit: 2021-09-10 | Discharge: 2021-09-10 | Disposition: A | Discharge status: Home / Self Care | Payer: 59 | Source: Ambulatory Visit | Attending: Nephrology | Admitting: Nephrology

## 2021-09-10 ENCOUNTER — Other Ambulatory Visit: Payer: Self-pay

## 2021-09-10 VITALS — BP 142/80 | HR 82 | Temp 97.0°F | Resp 18

## 2021-09-10 DIAGNOSIS — N183 Chronic kidney disease, stage 3 unspecified: Secondary | ICD-10-CM | POA: Diagnosis not present

## 2021-09-10 LAB — POCT HEMOGLOBIN-HEMACUE: Hemoglobin: 8.6 g/dL — ABNORMAL LOW (ref 12.0–15.0)

## 2021-09-10 MED ORDER — EPOETIN ALFA-EPBX 10000 UNIT/ML IJ SOLN
INTRAMUSCULAR | Status: AC
  Filled 2021-09-10: qty 1

## 2021-09-10 MED ORDER — EPOETIN ALFA-EPBX 10000 UNIT/ML IJ SOLN
10000.0000 [IU] | INTRAMUSCULAR | Status: DC
  Administered 2021-09-10: 10000 [IU] via SUBCUTANEOUS

## 2021-09-12 ENCOUNTER — Other Ambulatory Visit (HOSPITAL_COMMUNITY): Payer: Self-pay

## 2021-09-12 ENCOUNTER — Telehealth: Payer: Self-pay | Admitting: Internal Medicine

## 2021-09-12 DIAGNOSIS — E7801 Familial hypercholesterolemia: Secondary | ICD-10-CM

## 2021-09-12 MED ORDER — REPATHA SURECLICK 140 MG/ML ~~LOC~~ SOAJ
1.0000 | SUBCUTANEOUS | 11 refills | Status: DC
  Filled 2021-09-12: qty 2, 28d supply, fill #0

## 2021-09-12 NOTE — Telephone Encounter (Signed)
Traci Nose, MD  08/25/2021  8:09 PM EST     Labs look much better but still elevated. LP(a) is negative. Particle number is high. LDL-C 135. Good candidate to add Repatha - repeat lipid NMR in 3-4 months. She has genetically confirmed FH.   Dr H   Left message for patient with lab results.  Rx(s) sent to pharmacy electronically. NMR lipoprofile will be mailed

## 2021-09-12 NOTE — Telephone Encounter (Signed)
Patient is returning call to discuss lab results. 

## 2021-09-12 NOTE — Telephone Encounter (Signed)
Spoke with daughter and relayed results, per OK from patient.

## 2021-09-12 NOTE — Telephone Encounter (Signed)
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 She requested a detailed voice message with her results if she is unable to take the call.

## 2021-09-12 NOTE — Telephone Encounter (Signed)
Patient returning phone call.  Thanks!

## 2021-09-12 NOTE — Telephone Encounter (Signed)
° °  Pt is returning your call, pt is still at work, she said to leave her a detailed message or call her daughter Carmon Sails for her result at [phone]

## 2021-09-15 NOTE — Telephone Encounter (Signed)
Traci Nose, MD  08/25/2021  8:09 PM EST     Labs look much better but still elevated. LP(a) is negative. Particle number is high. LDL-C 135. Good candidate to add Repatha - repeat lipid NMR in 3-4 months. She has genetically confirmed FH.   Dr Rexene Edison   No PA needed for Repatha - attempted via [facility]

## 2021-09-24 ENCOUNTER — Encounter (HOSPITAL_COMMUNITY)
Admission: RE | Admit: 2021-09-24 | Discharge: 2021-09-24 | Disposition: A | Discharge status: Home / Self Care | Payer: 59 | Source: Ambulatory Visit | Attending: Nephrology | Admitting: Nephrology

## 2021-09-24 ENCOUNTER — Other Ambulatory Visit: Payer: Self-pay

## 2021-09-24 VITALS — BP 147/84 | HR 76 | Temp 97.4°F | Resp 18

## 2021-09-24 DIAGNOSIS — N183 Chronic kidney disease, stage 3 unspecified: Secondary | ICD-10-CM | POA: Diagnosis not present

## 2021-09-24 LAB — IRON AND TIBC
Iron: 58 ug/dL (ref 28–170)
Saturation Ratios: 17 % (ref 10.4–31.8)
TIBC: 343 ug/dL (ref 250–450)
UIBC: 285 ug/dL

## 2021-09-24 LAB — POCT HEMOGLOBIN-HEMACUE: Hemoglobin: 9.4 g/dL — ABNORMAL LOW (ref 12.0–15.0)

## 2021-09-24 LAB — FERRITIN: Ferritin: 201 ng/mL (ref 11–307)

## 2021-09-24 MED ORDER — EPOETIN ALFA-EPBX 10000 UNIT/ML IJ SOLN
10000.0000 [IU] | INTRAMUSCULAR | Status: DC
  Administered 2021-09-24: 10000 [IU] via SUBCUTANEOUS

## 2021-09-24 MED ORDER — EPOETIN ALFA-EPBX 10000 UNIT/ML IJ SOLN
INTRAMUSCULAR | Status: AC
  Filled 2021-09-24: qty 1

## 2021-10-08 ENCOUNTER — Other Ambulatory Visit (HOSPITAL_COMMUNITY): Payer: Self-pay

## 2021-10-08 ENCOUNTER — Other Ambulatory Visit: Payer: Self-pay

## 2021-10-08 ENCOUNTER — Ambulatory Visit (HOSPITAL_COMMUNITY)
Admission: RE | Admit: 2021-10-08 | Discharge: 2021-10-08 | Disposition: A | Discharge status: Home / Self Care | Payer: 59 | Source: Ambulatory Visit | Attending: Nephrology | Admitting: Nephrology

## 2021-10-08 VITALS — BP 148/85 | HR 74 | Temp 97.1°F | Resp 18

## 2021-10-08 DIAGNOSIS — N183 Chronic kidney disease, stage 3 unspecified: Secondary | ICD-10-CM | POA: Diagnosis not present

## 2021-10-08 DIAGNOSIS — D631 Anemia in chronic kidney disease: Secondary | ICD-10-CM | POA: Diagnosis not present

## 2021-10-08 DIAGNOSIS — Z791 Long term (current) use of non-steroidal anti-inflammatories (NSAID): Secondary | ICD-10-CM | POA: Diagnosis not present

## 2021-10-08 DIAGNOSIS — N1832 Chronic kidney disease, stage 3b: Secondary | ICD-10-CM | POA: Diagnosis not present

## 2021-10-08 DIAGNOSIS — I129 Hypertensive chronic kidney disease with stage 1 through stage 4 chronic kidney disease, or unspecified chronic kidney disease: Secondary | ICD-10-CM | POA: Diagnosis not present

## 2021-10-08 DIAGNOSIS — N2581 Secondary hyperparathyroidism of renal origin: Secondary | ICD-10-CM | POA: Diagnosis not present

## 2021-10-08 LAB — POCT HEMOGLOBIN-HEMACUE: Hemoglobin: 9.1 g/dL — ABNORMAL LOW (ref 12.0–15.0)

## 2021-10-08 MED ORDER — EPOETIN ALFA-EPBX 10000 UNIT/ML IJ SOLN
INTRAMUSCULAR | Status: AC
  Administered 2021-10-08: 10000 [IU] via SUBCUTANEOUS
  Filled 2021-10-08: qty 1

## 2021-10-08 MED ORDER — AMLODIPINE BESYLATE 10 MG PO TABS
10.0000 mg | ORAL_TABLET | Freq: Every day | ORAL | 3 refills | Status: AC
  Filled 2021-10-08: qty 90, 90d supply, fill #0

## 2021-10-08 MED ORDER — EPOETIN ALFA-EPBX 10000 UNIT/ML IJ SOLN: 10000.0000 [IU] | INTRAMUSCULAR | Status: DC

## 2021-10-21 ENCOUNTER — Other Ambulatory Visit (HOSPITAL_COMMUNITY): Payer: Self-pay

## 2021-10-22 ENCOUNTER — Other Ambulatory Visit: Payer: Self-pay

## 2021-10-22 ENCOUNTER — Encounter (HOSPITAL_COMMUNITY)
Admission: RE | Admit: 2021-10-22 | Discharge: 2021-10-22 | Disposition: A | Discharge status: Home / Self Care | Payer: 59 | Source: Ambulatory Visit | Attending: Nephrology | Admitting: Nephrology

## 2021-10-22 VITALS — BP 141/76 | HR 72 | Temp 97.0°F | Resp 18

## 2021-10-22 DIAGNOSIS — N189 Chronic kidney disease, unspecified: Secondary | ICD-10-CM | POA: Diagnosis not present

## 2021-10-22 DIAGNOSIS — D631 Anemia in chronic kidney disease: Secondary | ICD-10-CM | POA: Insufficient documentation

## 2021-10-22 DIAGNOSIS — N183 Chronic kidney disease, stage 3 unspecified: Secondary | ICD-10-CM

## 2021-10-22 LAB — POCT HEMOGLOBIN-HEMACUE: Hemoglobin: 9.1 g/dL — ABNORMAL LOW (ref 12.0–15.0)

## 2021-10-22 MED ORDER — SODIUM CHLORIDE 0.9 % IV SOLN
750.0000 mg | INTRAVENOUS | Status: DC
  Administered 2021-10-22: 750 mg via INTRAVENOUS
  Filled 2021-10-22: qty 15

## 2021-10-22 MED ORDER — EPOETIN ALFA-EPBX 10000 UNIT/ML IJ SOLN
INTRAMUSCULAR | Status: AC
  Filled 2021-10-22: qty 1

## 2021-10-22 MED ORDER — SODIUM CHLORIDE 0.9 % IV SOLN
510.0000 mg | INTRAVENOUS | Status: DC
  Filled 2021-10-22: qty 17

## 2021-10-22 MED ORDER — EPOETIN ALFA-EPBX 10000 UNIT/ML IJ SOLN
10000.0000 [IU] | INTRAMUSCULAR | Status: DC
  Administered 2021-10-22: 10000 [IU] via SUBCUTANEOUS

## 2021-10-31 ENCOUNTER — Other Ambulatory Visit (HOSPITAL_COMMUNITY): Payer: Self-pay

## 2021-10-31 DIAGNOSIS — Z0000 Encounter for general adult medical examination without abnormal findings: Secondary | ICD-10-CM | POA: Diagnosis not present

## 2021-10-31 DIAGNOSIS — Z23 Encounter for immunization: Secondary | ICD-10-CM | POA: Diagnosis not present

## 2021-10-31 DIAGNOSIS — I129 Hypertensive chronic kidney disease with stage 1 through stage 4 chronic kidney disease, or unspecified chronic kidney disease: Secondary | ICD-10-CM | POA: Diagnosis not present

## 2021-10-31 DIAGNOSIS — D649 Anemia, unspecified: Secondary | ICD-10-CM | POA: Diagnosis not present

## 2021-10-31 DIAGNOSIS — M12812 Other specific arthropathies, not elsewhere classified, left shoulder: Secondary | ICD-10-CM | POA: Diagnosis not present

## 2021-10-31 DIAGNOSIS — N183 Chronic kidney disease, stage 3 unspecified: Secondary | ICD-10-CM | POA: Diagnosis not present

## 2021-10-31 DIAGNOSIS — E785 Hyperlipidemia, unspecified: Secondary | ICD-10-CM | POA: Diagnosis not present

## 2021-10-31 DIAGNOSIS — R300 Dysuria: Secondary | ICD-10-CM | POA: Diagnosis not present

## 2021-10-31 MED ORDER — AMLODIPINE BESYLATE 10 MG PO TABS
10.0000 mg | ORAL_TABLET | ORAL | 3 refills | Status: DC
  Filled 2021-10-31: qty 90, 90d supply, fill #0

## 2021-11-05 ENCOUNTER — Encounter (HOSPITAL_COMMUNITY)
Admission: RE | Admit: 2021-11-05 | Discharge: 2021-11-05 | Disposition: A | Discharge status: Home / Self Care | Payer: 59 | Source: Ambulatory Visit | Attending: Nephrology | Admitting: Nephrology

## 2021-11-05 ENCOUNTER — Other Ambulatory Visit: Payer: Self-pay

## 2021-11-05 VITALS — BP 112/65 | HR 69 | Temp 97.5°F | Resp 18 | Wt 195.0 lb

## 2021-11-05 DIAGNOSIS — N183 Chronic kidney disease, stage 3 unspecified: Secondary | ICD-10-CM | POA: Insufficient documentation

## 2021-11-05 LAB — POCT HEMOGLOBIN-HEMACUE: Hemoglobin: 9.6 g/dL — ABNORMAL LOW (ref 12.0–15.0)

## 2021-11-05 MED ORDER — EPOETIN ALFA-EPBX 10000 UNIT/ML IJ SOLN
INTRAMUSCULAR | Status: AC
  Filled 2021-11-05: qty 1

## 2021-11-05 MED ORDER — EPOETIN ALFA-EPBX 10000 UNIT/ML IJ SOLN
10000.0000 [IU] | INTRAMUSCULAR | Status: DC
  Administered 2021-11-05: 10000 [IU] via SUBCUTANEOUS

## 2021-11-05 MED ORDER — SODIUM CHLORIDE 0.9 % IV SOLN
750.0000 mg | INTRAVENOUS | Status: DC
  Administered 2021-11-05: 750 mg via INTRAVENOUS
  Filled 2021-11-05: qty 15

## 2021-11-18 ENCOUNTER — Other Ambulatory Visit: Payer: Self-pay

## 2021-11-18 ENCOUNTER — Ambulatory Visit (INDEPENDENT_AMBULATORY_CARE_PROVIDER_SITE_OTHER): Payer: 59 | Admitting: Orthopaedic Surgery

## 2021-11-18 ENCOUNTER — Ambulatory Visit (INDEPENDENT_AMBULATORY_CARE_PROVIDER_SITE_OTHER): Payer: 59

## 2021-11-18 ENCOUNTER — Other Ambulatory Visit (HOSPITAL_COMMUNITY): Payer: Self-pay

## 2021-11-18 DIAGNOSIS — G8929 Other chronic pain: Secondary | ICD-10-CM | POA: Diagnosis not present

## 2021-11-18 DIAGNOSIS — M25512 Pain in left shoulder: Secondary | ICD-10-CM

## 2021-11-18 MED ORDER — PREDNISONE 10 MG (21) PO TBPK
ORAL_TABLET | ORAL | 3 refills | Status: DC
Start: 2021-11-18 — End: 2023-12-08
  Filled 2021-11-18: qty 21, 6d supply, fill #0

## 2021-11-18 MED ORDER — DICLOFENAC SODIUM 75 MG PO TBEC
75.0000 mg | DELAYED_RELEASE_TABLET | Freq: Two times a day (BID) | ORAL | 2 refills | Status: AC
  Filled 2021-11-18: qty 30, 15d supply, fill #0

## 2021-11-18 NOTE — Progress Notes (Signed)
? ?Office Visit Note ?  ?Patient: Traci Brown           ?Date of Birth: 02-13-1956           ?MRN: [id number] ?Visit Date: 11/18/2021 ?             ?Requested by: Kelton Pillar, MD ?[address] ?[address] ?[place],  [place] [address] ?PCP: Kelton Pillar, MD ? ? ?Assessment & Plan: ?Visit Diagnoses:  ?1. Chronic left shoulder pain   ? ? ?Plan: Impression chronic left shoulder pain.  Treatment options discussed and she declined cortisone injection on either try prednisone Dosepak.  She does have chronic kidney disease stage III so I told her not to pick up the prescription that I sent for diclofenac.  She will also try to do relative rest for 6 weeks.  If not better she may want to consider an shoulder injection. ? ?Follow-Up Instructions: No follow-ups on file.  ? ?Orders:  ?Orders Placed This Encounter  ?Procedures  ? XR Shoulder Left  ? ?Meds ordered this encounter  ?Medications  ? predniSONE (STERAPRED UNI-PAK 21 TAB) 10 MG (21) TBPK tablet  ?  Sig: Take as directed  ?  Dispense:  21 tablet  ?  Refill:  3  ? diclofenac (VOLTAREN) 75 MG EC tablet  ?  Sig: Take 1 tablet (75 mg total) by mouth 2 (two) times daily.  ?  Dispense:  30 tablet  ?  Refill:  2  ? ? ? ? Procedures: ?No procedures performed ? ? ?Clinical Data: ?No additional findings. ? ? ?Subjective: ?Chief Complaint  ?Patient presents with  ? Left Shoulder - Pain  ? ? ?HPI ? ?Traci Brown is a very pleasant 66 year old female here for evaluation of chronic left shoulder pain for a month.  Denies any injuries.  Left hand dominant.  Works as a transporter in the hospital.  Feels like there is some numbness and tingling in the arm but no neck pain.  Pain is worse in the morning.  Pain is along the anterior shoulder joint line.  Pain with raising the arm. ? ?Review of Systems  ?Constitutional: Negative.   ?HENT: Negative.    ?Eyes: Negative.   ?Respiratory: Negative.    ?Cardiovascular: Negative.   ?Endocrine: Negative.   ?Musculoskeletal: Negative.    ?Neurological: Negative.   ?Hematological: Negative.   ?Psychiatric/Behavioral: Negative.    ?All other systems reviewed and are negative. ? ? ?Objective: ?Vital Signs: There were no vitals taken for this visit. ? ?Physical Exam ?Vitals and nursing note reviewed.  ?Constitutional:   ?   Appearance: She is well-developed.  ?Pulmonary:  ?   Effort: Pulmonary effort is normal.  ?Skin: ?   General: Skin is warm.  ?   Capillary Refill: Capillary refill takes less than 2 seconds.  ?Neurological:  ?   Mental Status: She is alert and oriented to person, place, and time.  ?Psychiatric:     ?   Behavior: Behavior normal.     ?   Thought Content: Thought content normal.     ?   Judgment: Judgment normal.  ? ? ?Ortho Exam ? ?Examination of left shoulder shows pain with range of motion impingement and rotator cuff testing.  Cervical spine nontender with normal range of motion. ? ?Specialty Comments:  ?No specialty comments available. ? ?Imaging: ?XR Shoulder Left ? ?Result Date: 11/18/2021 ?No acute or structural abnormalities  ? ? ?PMFS History: ?Patient Active Problem List  ? Diagnosis  Date Noted  ? Palpitations 11/12/2019  ? Educated about COVID-19 virus infection 10/05/2019  ? Chest pain 11/13/2018  ? Preventative health care 06/18/2015  ? Paroxysmal supraventricular tachycardia ([place]) 06/18/2015  ? Abdominal pain, epigastric 06/18/2015  ? Leukopenia 10/16/2014  ? Rhabdomyolysis 10/09/2014  ? Anemia 05/02/2014  ? Thoracic spine pain 03/01/2014  ? Hypertension 07/08/2012  ? CKD (chronic kidney disease) stage 3, GFR 30-59 ml/min (HCC) 07/08/2012  ? HLD (hyperlipidemia) 12/15/2011  ? Leg pain 04/15/2011  ? ?Past Medical History:  ?Diagnosis Date  ? Abdominal pain, epigastric 06/18/2015  ? Anemia 05/02/2014  ? CKD (chronic kidney disease) stage 3, GFR 30-59 ml/min (HCC) 07/08/2012  ? Elevated LFTs   ? HLD (hyperlipidemia) 12/15/2011  ? Hypernatremia 07/22/2012  ? Hypertension 07/08/2012  ? Leukopenia 10/16/2014  ? Paroxysmal  supraventricular tachycardia ([place]) 06/18/2015  ? Rhabdomyolysis 10/09/2014  ? Thoracic spine pain 03/01/2014  ? Vaginal atrophy   ?  ?Family History  ?Problem Relation Age of Onset  ? Hypertension Mother   ? Other Mother   ?     WAR VICTIM IN [place]  ? Other Father   ?     WAR VICTIM IN [place]  ? Heart disease Neg Hx   ?  ?Past Surgical History:  ?Procedure Laterality Date  ? BACK SURGERY    ? CESAREAN SECTION  2010  ? CHOLECYSTECTOMY    ? LACERATION REPAIR Right 2010  ? ON HANDS AND FACE  ? SVT ABLATION N/A 02/06/2020  ? Procedure: SVT ABLATION;  Surgeon: Evans Lance, MD;  Location: [facility];  Service: Cardiovascular;  Laterality: N/A;  ? womb tied (in [place] exact procedure unknown)    ? ?Social History  ? ?Occupational History  ? Occupation: Museum/gallery conservator  ?  Employer: [employer]  ?Tobacco Use  ? Smoking status: Never  ? Smokeless tobacco: Never  ?Substance and Sexual Activity  ? Alcohol use: No  ? Drug use: No  ? Sexual activity: Yes  ? ? ? ? ? ? ?

## 2021-11-19 ENCOUNTER — Encounter (HOSPITAL_COMMUNITY)
Admission: RE | Admit: 2021-11-19 | Discharge: 2021-11-19 | Disposition: A | Discharge status: Home / Self Care | Payer: 59 | Source: Ambulatory Visit | Attending: Nephrology | Admitting: Nephrology

## 2021-11-19 VITALS — BP 128/73 | HR 78 | Temp 97.5°F | Resp 18

## 2021-11-19 DIAGNOSIS — N183 Chronic kidney disease, stage 3 unspecified: Secondary | ICD-10-CM | POA: Diagnosis not present

## 2021-11-19 LAB — FERRITIN: Ferritin: 1133 ng/mL — ABNORMAL HIGH (ref 11–307)

## 2021-11-19 LAB — IRON AND TIBC
Iron: 119 ug/dL (ref 28–170)
Saturation Ratios: 39 % — ABNORMAL HIGH (ref 10.4–31.8)
TIBC: 305 ug/dL (ref 250–450)
UIBC: 186 ug/dL

## 2021-11-19 LAB — POCT HEMOGLOBIN-HEMACUE: Hemoglobin: 10.6 g/dL — ABNORMAL LOW (ref 12.0–15.0)

## 2021-11-19 MED ORDER — EPOETIN ALFA-EPBX 10000 UNIT/ML IJ SOLN
10000.0000 [IU] | INTRAMUSCULAR | Status: DC
  Administered 2021-11-19: 10000 [IU] via SUBCUTANEOUS

## 2021-11-19 MED ORDER — EPOETIN ALFA-EPBX 10000 UNIT/ML IJ SOLN
INTRAMUSCULAR | Status: AC
  Filled 2021-11-19: qty 1

## 2021-12-04 ENCOUNTER — Encounter (HOSPITAL_COMMUNITY)
Admission: RE | Admit: 2021-12-04 | Discharge: 2021-12-04 | Disposition: A | Discharge status: Home / Self Care | Payer: 59 | Source: Ambulatory Visit | Attending: Nephrology | Admitting: Nephrology

## 2021-12-04 VITALS — BP 127/75 | HR 80 | Temp 97.3°F | Resp 18

## 2021-12-04 DIAGNOSIS — N183 Chronic kidney disease, stage 3 unspecified: Secondary | ICD-10-CM | POA: Diagnosis not present

## 2021-12-04 MED ORDER — EPOETIN ALFA-EPBX 10000 UNIT/ML IJ SOLN
INTRAMUSCULAR | Status: AC
  Filled 2021-12-04: qty 1

## 2021-12-04 MED ORDER — EPOETIN ALFA-EPBX 10000 UNIT/ML IJ SOLN
10000.0000 [IU] | INTRAMUSCULAR | Status: DC
  Administered 2021-12-04: 10000 [IU] via SUBCUTANEOUS

## 2021-12-05 LAB — POCT HEMOGLOBIN-HEMACUE: Hemoglobin: 11 g/dL — ABNORMAL LOW (ref 12.0–15.0)

## 2021-12-15 DIAGNOSIS — Z01419 Encounter for gynecological examination (general) (routine) without abnormal findings: Secondary | ICD-10-CM | POA: Diagnosis not present

## 2021-12-19 ENCOUNTER — Encounter (HOSPITAL_COMMUNITY): Payer: 59

## 2021-12-26 ENCOUNTER — Ambulatory Visit (HOSPITAL_COMMUNITY)
Admission: RE | Admit: 2021-12-26 | Discharge: 2021-12-26 | Disposition: A | Discharge status: Home / Self Care | Payer: 59 | Source: Ambulatory Visit | Attending: Nephrology | Admitting: Nephrology

## 2021-12-26 VITALS — BP 137/79 | HR 82 | Temp 97.3°F | Resp 18

## 2021-12-26 DIAGNOSIS — N183 Chronic kidney disease, stage 3 unspecified: Secondary | ICD-10-CM | POA: Insufficient documentation

## 2021-12-26 LAB — IRON AND TIBC
Iron: 119 ug/dL (ref 28–170)
Saturation Ratios: 41 % — ABNORMAL HIGH (ref 10.4–31.8)
TIBC: 290 ug/dL (ref 250–450)
UIBC: 171 ug/dL

## 2021-12-26 LAB — FERRITIN: Ferritin: 870 ng/mL — ABNORMAL HIGH (ref 11–307)

## 2021-12-26 LAB — POCT HEMOGLOBIN-HEMACUE: Hemoglobin: 11.3 g/dL — ABNORMAL LOW (ref 12.0–15.0)

## 2021-12-26 MED ORDER — EPOETIN ALFA-EPBX 10000 UNIT/ML IJ SOLN
INTRAMUSCULAR | Status: AC
  Filled 2021-12-26: qty 1

## 2021-12-26 MED ORDER — EPOETIN ALFA-EPBX 10000 UNIT/ML IJ SOLN
10000.0000 [IU] | INTRAMUSCULAR | Status: DC
  Administered 2021-12-26: 10000 [IU] via SUBCUTANEOUS

## 2022-01-07 ENCOUNTER — Other Ambulatory Visit: Payer: Self-pay | Admitting: *Deleted

## 2022-01-07 DIAGNOSIS — E7801 Familial hypercholesterolemia: Secondary | ICD-10-CM

## 2022-01-09 ENCOUNTER — Inpatient Hospital Stay (HOSPITAL_COMMUNITY): Admission: RE | Admit: 2022-01-09 | Payer: 59 | Source: Ambulatory Visit

## 2022-01-14 ENCOUNTER — Ambulatory Visit (HOSPITAL_BASED_OUTPATIENT_CLINIC_OR_DEPARTMENT_OTHER): Payer: 59 | Admitting: Internal Medicine

## 2022-01-21 ENCOUNTER — Other Ambulatory Visit (HOSPITAL_COMMUNITY): Payer: Self-pay

## 2022-01-21 DIAGNOSIS — R109 Unspecified abdominal pain: Secondary | ICD-10-CM | POA: Diagnosis not present

## 2022-01-21 MED ORDER — NITROFURANTOIN MONOHYD MACRO 100 MG PO CAPS
100.0000 mg | ORAL_CAPSULE | Freq: Two times a day (BID) | ORAL | 0 refills | Status: DC
  Filled 2022-01-21: qty 10, 5d supply, fill #0

## 2022-01-28 ENCOUNTER — Ambulatory Visit
Admission: RE | Admit: 2022-01-28 | Discharge: 2022-01-28 | Disposition: A | Discharge status: Home / Self Care | Payer: 59 | Source: Ambulatory Visit | Attending: Internal Medicine | Admitting: Internal Medicine

## 2022-01-28 ENCOUNTER — Other Ambulatory Visit: Payer: Self-pay | Admitting: Internal Medicine

## 2022-01-28 DIAGNOSIS — R1031 Right lower quadrant pain: Secondary | ICD-10-CM | POA: Diagnosis not present

## 2022-01-28 DIAGNOSIS — R109 Unspecified abdominal pain: Secondary | ICD-10-CM | POA: Diagnosis not present

## 2022-01-30 DIAGNOSIS — R7989 Other specified abnormal findings of blood chemistry: Secondary | ICD-10-CM | POA: Diagnosis not present

## 2022-01-30 DIAGNOSIS — R935 Abnormal findings on diagnostic imaging of other abdominal regions, including retroperitoneum: Secondary | ICD-10-CM | POA: Diagnosis not present

## 2022-01-30 DIAGNOSIS — R946 Abnormal results of thyroid function studies: Secondary | ICD-10-CM | POA: Diagnosis not present

## 2022-02-05 ENCOUNTER — Other Ambulatory Visit (HOSPITAL_COMMUNITY): Payer: Self-pay | Admitting: Internal Medicine

## 2022-02-05 DIAGNOSIS — I3139 Other pericardial effusion (noninflammatory): Secondary | ICD-10-CM

## 2022-02-23 ENCOUNTER — Ambulatory Visit (HOSPITAL_COMMUNITY): Payer: 59 | Attending: Internal Medicine

## 2022-02-23 DIAGNOSIS — I3139 Other pericardial effusion (noninflammatory): Secondary | ICD-10-CM | POA: Diagnosis not present

## 2022-02-24 LAB — ECHOCARDIOGRAM COMPLETE
Area-P 1/2: 3.76 cm2
S' Lateral: 2.4 cm

## 2022-03-05 ENCOUNTER — Other Ambulatory Visit (HOSPITAL_COMMUNITY): Payer: Self-pay

## 2022-03-05 DIAGNOSIS — I129 Hypertensive chronic kidney disease with stage 1 through stage 4 chronic kidney disease, or unspecified chronic kidney disease: Secondary | ICD-10-CM | POA: Diagnosis not present

## 2022-03-05 DIAGNOSIS — N2581 Secondary hyperparathyroidism of renal origin: Secondary | ICD-10-CM | POA: Diagnosis not present

## 2022-03-05 DIAGNOSIS — N1832 Chronic kidney disease, stage 3b: Secondary | ICD-10-CM | POA: Diagnosis not present

## 2022-03-05 DIAGNOSIS — Z791 Long term (current) use of non-steroidal anti-inflammatories (NSAID): Secondary | ICD-10-CM | POA: Diagnosis not present

## 2022-03-05 DIAGNOSIS — D631 Anemia in chronic kidney disease: Secondary | ICD-10-CM | POA: Diagnosis not present

## 2022-03-05 MED ORDER — CHLORTHALIDONE 25 MG PO TABS
25.0000 mg | ORAL_TABLET | Freq: Every day | ORAL | 3 refills | Status: AC
  Filled 2022-03-05: qty 90, 90d supply, fill #0

## 2022-03-23 ENCOUNTER — Ambulatory Visit (HOSPITAL_COMMUNITY)
Admission: RE | Admit: 2022-03-23 | Discharge: 2022-03-23 | Disposition: A | Discharge status: Home / Self Care | Payer: 59 | Source: Ambulatory Visit | Attending: Nephrology | Admitting: Nephrology

## 2022-03-23 VITALS — BP 143/81 | HR 81 | Temp 97.3°F | Resp 18

## 2022-03-23 DIAGNOSIS — N183 Chronic kidney disease, stage 3 unspecified: Secondary | ICD-10-CM | POA: Diagnosis not present

## 2022-03-23 LAB — POCT HEMOGLOBIN-HEMACUE: Hemoglobin: 10.4 g/dL — ABNORMAL LOW (ref 12.0–15.0)

## 2022-03-23 MED ORDER — EPOETIN ALFA 10000 UNIT/ML IJ SOLN
10000.0000 [IU] | Freq: Once | INTRAMUSCULAR | Status: AC
  Administered 2022-03-23: 10000 [IU] via SUBCUTANEOUS

## 2022-03-23 MED ORDER — EPOETIN ALFA 10000 UNIT/ML IJ SOLN
INTRAMUSCULAR | Status: AC
  Filled 2022-03-23: qty 1

## 2022-03-23 MED ORDER — EPOETIN ALFA-EPBX 10000 UNIT/ML IJ SOLN: 10000.0000 [IU] | INTRAMUSCULAR | Status: DC

## 2022-04-06 ENCOUNTER — Encounter (HOSPITAL_COMMUNITY)
Admission: RE | Admit: 2022-04-06 | Discharge: 2022-04-06 | Disposition: A | Discharge status: Home / Self Care | Payer: 59 | Source: Ambulatory Visit | Attending: Nephrology | Admitting: Nephrology

## 2022-04-06 VITALS — BP 132/80 | HR 93 | Temp 97.1°F | Resp 18

## 2022-04-06 DIAGNOSIS — N183 Chronic kidney disease, stage 3 unspecified: Secondary | ICD-10-CM | POA: Diagnosis not present

## 2022-04-06 LAB — POCT HEMOGLOBIN-HEMACUE: Hemoglobin: 9.7 g/dL — ABNORMAL LOW (ref 12.0–15.0)

## 2022-04-06 MED ORDER — EPOETIN ALFA-EPBX 10000 UNIT/ML IJ SOLN: 10000.0000 [IU] | INTRAMUSCULAR | Status: DC

## 2022-04-06 MED ORDER — EPOETIN ALFA 10000 UNIT/ML IJ SOLN
10000.0000 [IU] | Freq: Once | INTRAMUSCULAR | Status: AC
  Administered 2022-04-06: 10000 [IU] via SUBCUTANEOUS

## 2022-04-06 MED ORDER — EPOETIN ALFA 10000 UNIT/ML IJ SOLN
INTRAMUSCULAR | Status: AC
  Filled 2022-04-06: qty 1

## 2022-04-20 ENCOUNTER — Encounter (HOSPITAL_COMMUNITY)
Admission: RE | Admit: 2022-04-20 | Discharge: 2022-04-20 | Disposition: A | Discharge status: Home / Self Care | Payer: 59 | Source: Ambulatory Visit | Attending: Nephrology | Admitting: Nephrology

## 2022-04-20 VITALS — BP 125/71 | HR 85

## 2022-04-20 DIAGNOSIS — N183 Chronic kidney disease, stage 3 unspecified: Secondary | ICD-10-CM | POA: Insufficient documentation

## 2022-04-20 LAB — IRON AND TIBC
Iron: 87 ug/dL (ref 28–170)
Saturation Ratios: 28 % (ref 10.4–31.8)
TIBC: 316 ug/dL (ref 250–450)
UIBC: 229 ug/dL

## 2022-04-20 LAB — POCT HEMOGLOBIN-HEMACUE: Hemoglobin: 9.4 g/dL — ABNORMAL LOW (ref 12.0–15.0)

## 2022-04-20 LAB — FERRITIN: Ferritin: 657 ng/mL — ABNORMAL HIGH (ref 11–307)

## 2022-04-20 MED ORDER — EPOETIN ALFA 10000 UNIT/ML IJ SOLN
10000.0000 [IU] | Freq: Once | INTRAMUSCULAR | Status: AC
  Administered 2022-04-20: 10000 [IU] via SUBCUTANEOUS

## 2022-04-20 MED ORDER — EPOETIN ALFA-EPBX 10000 UNIT/ML IJ SOLN: 10000.0000 [IU] | INTRAMUSCULAR | Status: DC

## 2022-04-20 MED ORDER — EPOETIN ALFA 10000 UNIT/ML IJ SOLN
INTRAMUSCULAR | Status: AC
  Filled 2022-04-20: qty 1

## 2022-04-24 ENCOUNTER — Ambulatory Visit (HOSPITAL_BASED_OUTPATIENT_CLINIC_OR_DEPARTMENT_OTHER): Payer: 59 | Admitting: Internal Medicine

## 2022-05-04 ENCOUNTER — Encounter (HOSPITAL_COMMUNITY): Payer: 59

## 2022-05-06 ENCOUNTER — Encounter (HOSPITAL_COMMUNITY)
Admission: RE | Admit: 2022-05-06 | Discharge: 2022-05-06 | Disposition: A | Discharge status: Home / Self Care | Payer: 59 | Source: Ambulatory Visit | Attending: Nephrology | Admitting: Nephrology

## 2022-05-06 VITALS — BP 146/77 | HR 81 | Temp 97.0°F | Resp 12

## 2022-05-06 DIAGNOSIS — N183 Chronic kidney disease, stage 3 unspecified: Secondary | ICD-10-CM

## 2022-05-06 MED ORDER — EPOETIN ALFA-EPBX 10000 UNIT/ML IJ SOLN: 10000.0000 [IU] | INTRAMUSCULAR | Status: DC

## 2022-05-06 MED ORDER — EPOETIN ALFA 10000 UNIT/ML IJ SOLN
INTRAMUSCULAR | Status: AC
  Filled 2022-05-06: qty 1

## 2022-05-06 MED ORDER — EPOETIN ALFA 10000 UNIT/ML IJ SOLN: 10000.0000 [IU] | Freq: Once | INTRAMUSCULAR | Status: AC

## 2022-05-13 ENCOUNTER — Other Ambulatory Visit: Payer: Self-pay | Admitting: Nurse Practitioner

## 2022-05-13 DIAGNOSIS — R748 Abnormal levels of other serum enzymes: Secondary | ICD-10-CM | POA: Diagnosis not present

## 2022-05-13 DIAGNOSIS — R768 Other specified abnormal immunological findings in serum: Secondary | ICD-10-CM | POA: Diagnosis not present

## 2022-05-13 DIAGNOSIS — K760 Fatty (change of) liver, not elsewhere classified: Secondary | ICD-10-CM | POA: Diagnosis not present

## 2022-05-18 ENCOUNTER — Ambulatory Visit
Admission: RE | Admit: 2022-05-18 | Discharge: 2022-05-18 | Disposition: A | Discharge status: Home / Self Care | Payer: 59 | Source: Ambulatory Visit | Attending: Nurse Practitioner | Admitting: Nurse Practitioner

## 2022-05-18 DIAGNOSIS — R7989 Other specified abnormal findings of blood chemistry: Secondary | ICD-10-CM | POA: Diagnosis not present

## 2022-05-18 DIAGNOSIS — R748 Abnormal levels of other serum enzymes: Secondary | ICD-10-CM

## 2022-05-20 ENCOUNTER — Encounter (HOSPITAL_COMMUNITY)
Admission: RE | Admit: 2022-05-20 | Discharge: 2022-05-20 | Disposition: A | Discharge status: Home / Self Care | Payer: 59 | Source: Ambulatory Visit | Attending: Nephrology | Admitting: Nephrology

## 2022-05-20 VITALS — BP 133/72 | HR 79 | Temp 97.1°F | Resp 18

## 2022-05-20 DIAGNOSIS — N183 Chronic kidney disease, stage 3 unspecified: Secondary | ICD-10-CM | POA: Diagnosis not present

## 2022-05-20 LAB — IRON AND TIBC
Iron: 69 ug/dL (ref 28–170)
Saturation Ratios: 25 % (ref 10.4–31.8)
TIBC: 281 ug/dL (ref 250–450)
UIBC: 212 ug/dL

## 2022-05-20 LAB — FERRITIN: Ferritin: 601 ng/mL — ABNORMAL HIGH (ref 11–307)

## 2022-05-20 MED ORDER — EPOETIN ALFA-EPBX 10000 UNIT/ML IJ SOLN
INTRAMUSCULAR | Status: AC
  Filled 2022-05-20: qty 1

## 2022-05-20 MED ORDER — EPOETIN ALFA-EPBX 10000 UNIT/ML IJ SOLN
10000.0000 [IU] | INTRAMUSCULAR | Status: DC
  Administered 2022-05-20: 10000 [IU] via SUBCUTANEOUS

## 2022-05-21 LAB — POCT HEMOGLOBIN-HEMACUE: Hemoglobin: 11.3 g/dL — ABNORMAL LOW (ref 12.0–15.0)

## 2022-05-22 LAB — POCT HEMOGLOBIN-HEMACUE: Hemoglobin: 11.2 g/dL — ABNORMAL LOW (ref 12.0–15.0)

## 2022-05-25 ENCOUNTER — Encounter: Payer: Self-pay | Admitting: *Deleted

## 2022-06-03 ENCOUNTER — Encounter (HOSPITAL_COMMUNITY)
Admission: RE | Admit: 2022-06-03 | Discharge: 2022-06-03 | Disposition: A | Discharge status: Home / Self Care | Payer: 59 | Source: Ambulatory Visit | Attending: Nephrology | Admitting: Nephrology

## 2022-06-03 VITALS — BP 138/78 | HR 74 | Resp 18

## 2022-06-03 DIAGNOSIS — N183 Chronic kidney disease, stage 3 unspecified: Secondary | ICD-10-CM | POA: Diagnosis not present

## 2022-06-03 LAB — POCT HEMOGLOBIN-HEMACUE: Hemoglobin: 10.5 g/dL — ABNORMAL LOW (ref 12.0–15.0)

## 2022-06-03 MED ORDER — EPOETIN ALFA-EPBX 10000 UNIT/ML IJ SOLN
10000.0000 [IU] | INTRAMUSCULAR | Status: DC
  Administered 2022-06-03: 10000 [IU] via SUBCUTANEOUS

## 2022-06-03 MED ORDER — EPOETIN ALFA-EPBX 10000 UNIT/ML IJ SOLN
INTRAMUSCULAR | Status: AC
  Filled 2022-06-03: qty 1

## 2022-06-05 ENCOUNTER — Other Ambulatory Visit: Payer: Self-pay | Admitting: Family Medicine

## 2022-06-05 DIAGNOSIS — Z1231 Encounter for screening mammogram for malignant neoplasm of breast: Secondary | ICD-10-CM

## 2022-06-16 ENCOUNTER — Ambulatory Visit (HOSPITAL_BASED_OUTPATIENT_CLINIC_OR_DEPARTMENT_OTHER): Payer: 59 | Admitting: Internal Medicine

## 2022-06-17 ENCOUNTER — Encounter (HOSPITAL_COMMUNITY)
Admission: RE | Admit: 2022-06-17 | Discharge: 2022-06-17 | Disposition: A | Discharge status: Home / Self Care | Payer: 59 | Source: Ambulatory Visit | Attending: Nephrology | Admitting: Nephrology

## 2022-06-17 VITALS — BP 135/105 | HR 77 | Temp 97.3°F | Resp 17

## 2022-06-17 DIAGNOSIS — N183 Chronic kidney disease, stage 3 unspecified: Secondary | ICD-10-CM | POA: Diagnosis not present

## 2022-06-17 MED ORDER — EPOETIN ALFA-EPBX 10000 UNIT/ML IJ SOLN
INTRAMUSCULAR | Status: AC
  Filled 2022-06-17: qty 1

## 2022-06-17 MED ORDER — EPOETIN ALFA-EPBX 10000 UNIT/ML IJ SOLN
10000.0000 [IU] | INTRAMUSCULAR | Status: DC
  Administered 2022-06-17: 10000 [IU] via SUBCUTANEOUS

## 2022-06-18 LAB — POCT HEMOGLOBIN-HEMACUE: Hemoglobin: 10.9 g/dL — ABNORMAL LOW (ref 12.0–15.0)

## 2022-07-01 ENCOUNTER — Encounter (HOSPITAL_COMMUNITY): Payer: 59

## 2022-07-07 ENCOUNTER — Encounter (HOSPITAL_COMMUNITY)
Admission: RE | Admit: 2022-07-07 | Discharge: 2022-07-07 | Disposition: A | Discharge status: Home / Self Care | Payer: 59 | Source: Ambulatory Visit | Attending: Nephrology | Admitting: Nephrology

## 2022-07-07 VITALS — BP 128/79 | HR 80 | Resp 18

## 2022-07-07 DIAGNOSIS — N183 Chronic kidney disease, stage 3 unspecified: Secondary | ICD-10-CM

## 2022-07-07 LAB — IRON AND TIBC
Iron: 82 ug/dL (ref 28–170)
Saturation Ratios: 26 % (ref 10.4–31.8)
TIBC: 322 ug/dL (ref 250–450)
UIBC: 240 ug/dL

## 2022-07-07 LAB — POCT HEMOGLOBIN-HEMACUE: Hemoglobin: 11.3 g/dL — ABNORMAL LOW (ref 12.0–15.0)

## 2022-07-07 LAB — FERRITIN: Ferritin: 554 ng/mL — ABNORMAL HIGH (ref 11–307)

## 2022-07-07 MED ORDER — EPOETIN ALFA-EPBX 10000 UNIT/ML IJ SOLN
INTRAMUSCULAR | Status: AC
  Filled 2022-07-07: qty 1

## 2022-07-07 MED ORDER — EPOETIN ALFA-EPBX 10000 UNIT/ML IJ SOLN
10000.0000 [IU] | INTRAMUSCULAR | Status: DC
  Administered 2022-07-07: 10000 [IU] via SUBCUTANEOUS

## 2022-07-13 ENCOUNTER — Ambulatory Visit
Admission: RE | Admit: 2022-07-13 | Discharge: 2022-07-13 | Disposition: A | Discharge status: Home / Self Care | Payer: 59 | Source: Ambulatory Visit | Attending: Family Medicine | Admitting: Family Medicine

## 2022-07-13 DIAGNOSIS — Z1231 Encounter for screening mammogram for malignant neoplasm of breast: Secondary | ICD-10-CM | POA: Diagnosis not present

## 2022-07-21 ENCOUNTER — Encounter (HOSPITAL_COMMUNITY)
Admission: RE | Admit: 2022-07-21 | Discharge: 2022-07-21 | Disposition: A | Discharge status: Home / Self Care | Payer: 59 | Source: Ambulatory Visit | Attending: Nephrology | Admitting: Nephrology

## 2022-07-21 VITALS — BP 116/72 | HR 84 | Temp 97.4°F | Resp 19

## 2022-07-21 DIAGNOSIS — N183 Chronic kidney disease, stage 3 unspecified: Secondary | ICD-10-CM | POA: Diagnosis not present

## 2022-07-21 LAB — POCT HEMOGLOBIN-HEMACUE: Hemoglobin: 11.6 g/dL — ABNORMAL LOW (ref 12.0–15.0)

## 2022-07-21 MED ORDER — EPOETIN ALFA-EPBX 10000 UNIT/ML IJ SOLN
10000.0000 [IU] | INTRAMUSCULAR | Status: DC
  Administered 2022-07-21: 10000 [IU] via SUBCUTANEOUS

## 2022-07-21 MED ORDER — EPOETIN ALFA-EPBX 10000 UNIT/ML IJ SOLN
INTRAMUSCULAR | Status: AC
  Filled 2022-07-21: qty 1

## 2022-08-04 ENCOUNTER — Encounter (HOSPITAL_COMMUNITY)
Admission: RE | Admit: 2022-08-04 | Discharge: 2022-08-04 | Disposition: A | Discharge status: Home / Self Care | Payer: 59 | Source: Ambulatory Visit | Attending: Nephrology | Admitting: Nephrology

## 2022-08-04 VITALS — BP 128/74 | HR 86 | Temp 98.9°F | Resp 17

## 2022-08-04 DIAGNOSIS — N183 Chronic kidney disease, stage 3 unspecified: Secondary | ICD-10-CM

## 2022-08-04 LAB — IRON AND TIBC
Iron: 67 ug/dL (ref 28–170)
Saturation Ratios: 24 % (ref 10.4–31.8)
TIBC: 283 ug/dL (ref 250–450)
UIBC: 216 ug/dL

## 2022-08-04 LAB — POCT HEMOGLOBIN-HEMACUE: Hemoglobin: 10.8 g/dL — ABNORMAL LOW (ref 12.0–15.0)

## 2022-08-04 LAB — FERRITIN: Ferritin: 568 ng/mL — ABNORMAL HIGH (ref 11–307)

## 2022-08-04 MED ORDER — EPOETIN ALFA-EPBX 10000 UNIT/ML IJ SOLN
10000.0000 [IU] | INTRAMUSCULAR | Status: DC
  Administered 2022-08-04: 10000 [IU] via SUBCUTANEOUS

## 2022-08-04 MED ORDER — EPOETIN ALFA-EPBX 10000 UNIT/ML IJ SOLN
INTRAMUSCULAR | Status: AC
  Filled 2022-08-04: qty 1

## 2022-08-18 ENCOUNTER — Encounter (HOSPITAL_COMMUNITY)
Admission: RE | Admit: 2022-08-18 | Discharge: 2022-08-18 | Disposition: A | Discharge status: Home / Self Care | Payer: 59 | Source: Ambulatory Visit | Attending: Nephrology | Admitting: Nephrology

## 2022-08-18 VITALS — BP 126/75 | HR 85 | Temp 97.4°F | Resp 18

## 2022-08-18 DIAGNOSIS — N183 Chronic kidney disease, stage 3 unspecified: Secondary | ICD-10-CM | POA: Insufficient documentation

## 2022-08-18 LAB — POCT HEMOGLOBIN-HEMACUE: Hemoglobin: 10.1 g/dL — ABNORMAL LOW (ref 12.0–15.0)

## 2022-08-18 MED ORDER — EPOETIN ALFA-EPBX 10000 UNIT/ML IJ SOLN
10000.0000 [IU] | INTRAMUSCULAR | Status: DC
  Administered 2022-08-18: 10000 [IU] via SUBCUTANEOUS

## 2022-08-18 MED ORDER — EPOETIN ALFA-EPBX 10000 UNIT/ML IJ SOLN
INTRAMUSCULAR | Status: AC
  Filled 2022-08-18: qty 1

## 2022-08-20 ENCOUNTER — Encounter (HOSPITAL_COMMUNITY): Payer: Self-pay

## 2022-08-22 ENCOUNTER — Encounter (HOSPITAL_COMMUNITY): Payer: Self-pay

## 2022-09-01 ENCOUNTER — Inpatient Hospital Stay (HOSPITAL_COMMUNITY): Admission: RE | Admit: 2022-09-01 | Payer: 59 | Source: Ambulatory Visit

## 2022-09-02 ENCOUNTER — Encounter (HOSPITAL_COMMUNITY)
Admission: RE | Admit: 2022-09-02 | Discharge: 2022-09-02 | Disposition: A | Discharge status: Home / Self Care | Payer: 59 | Source: Ambulatory Visit | Attending: Nephrology | Admitting: Nephrology

## 2022-09-02 VITALS — BP 142/79 | HR 79 | Temp 97.3°F | Resp 18

## 2022-09-02 DIAGNOSIS — N183 Chronic kidney disease, stage 3 unspecified: Secondary | ICD-10-CM

## 2022-09-02 LAB — IRON AND TIBC
Iron: 81 ug/dL (ref 28–170)
Saturation Ratios: 27 % (ref 10.4–31.8)
TIBC: 298 ug/dL (ref 250–450)
UIBC: 217 ug/dL

## 2022-09-02 LAB — POCT HEMOGLOBIN-HEMACUE: Hemoglobin: 10.5 g/dL — ABNORMAL LOW (ref 12.0–15.0)

## 2022-09-02 LAB — FERRITIN: Ferritin: 571 ng/mL — ABNORMAL HIGH (ref 11–307)

## 2022-09-02 MED ORDER — EPOETIN ALFA-EPBX 10000 UNIT/ML IJ SOLN
INTRAMUSCULAR | Status: AC
  Filled 2022-09-02: qty 1

## 2022-09-02 MED ORDER — EPOETIN ALFA-EPBX 10000 UNIT/ML IJ SOLN
10000.0000 [IU] | INTRAMUSCULAR | Status: DC
  Administered 2022-09-02: 10000 [IU] via SUBCUTANEOUS

## 2022-09-08 ENCOUNTER — Encounter (HOSPITAL_COMMUNITY): Payer: Self-pay

## 2022-09-15 ENCOUNTER — Encounter (HOSPITAL_COMMUNITY)
Admission: RE | Admit: 2022-09-15 | Discharge: 2022-09-15 | Disposition: A | Discharge status: Home / Self Care | Payer: 59 | Source: Ambulatory Visit | Attending: Nephrology | Admitting: Nephrology

## 2022-09-15 VITALS — BP 139/83 | HR 78 | Temp 97.2°F | Resp 18

## 2022-09-15 DIAGNOSIS — N183 Chronic kidney disease, stage 3 unspecified: Secondary | ICD-10-CM | POA: Insufficient documentation

## 2022-09-15 LAB — POCT HEMOGLOBIN-HEMACUE: Hemoglobin: 10.2 g/dL — ABNORMAL LOW (ref 12.0–15.0)

## 2022-09-15 MED ORDER — EPOETIN ALFA-EPBX 10000 UNIT/ML IJ SOLN
INTRAMUSCULAR | Status: AC
  Filled 2022-09-15: qty 1

## 2022-09-15 MED ORDER — EPOETIN ALFA-EPBX 10000 UNIT/ML IJ SOLN
10000.0000 [IU] | INTRAMUSCULAR | Status: DC
  Administered 2022-09-15: 10000 [IU] via SUBCUTANEOUS

## 2022-09-29 ENCOUNTER — Encounter (HOSPITAL_COMMUNITY)
Admission: RE | Admit: 2022-09-29 | Discharge: 2022-09-29 | Disposition: A | Discharge status: Home / Self Care | Payer: 59 | Source: Ambulatory Visit | Attending: Nephrology | Admitting: Nephrology

## 2022-09-29 VITALS — BP 129/73 | HR 85 | Temp 97.1°F | Resp 18

## 2022-09-29 DIAGNOSIS — N183 Chronic kidney disease, stage 3 unspecified: Secondary | ICD-10-CM

## 2022-09-29 LAB — POCT HEMOGLOBIN-HEMACUE: Hemoglobin: 10.6 g/dL — ABNORMAL LOW (ref 12.0–15.0)

## 2022-09-29 LAB — IRON AND TIBC
Iron: 74 ug/dL (ref 28–170)
Saturation Ratios: 26 % (ref 10.4–31.8)
TIBC: 284 ug/dL (ref 250–450)
UIBC: 210 ug/dL

## 2022-09-29 LAB — FERRITIN: Ferritin: 480 ng/mL — ABNORMAL HIGH (ref 11–307)

## 2022-09-29 MED ORDER — EPOETIN ALFA-EPBX 10000 UNIT/ML IJ SOLN
INTRAMUSCULAR | Status: AC
  Filled 2022-09-29: qty 1

## 2022-09-29 MED ORDER — EPOETIN ALFA-EPBX 10000 UNIT/ML IJ SOLN
10000.0000 [IU] | INTRAMUSCULAR | Status: DC
  Administered 2022-09-29: 10000 [IU] via SUBCUTANEOUS

## 2022-10-13 ENCOUNTER — Encounter (HOSPITAL_COMMUNITY)
Admission: RE | Admit: 2022-10-13 | Discharge: 2022-10-13 | Disposition: A | Discharge status: Home / Self Care | Payer: 59 | Source: Ambulatory Visit | Attending: Nephrology | Admitting: Nephrology

## 2022-10-13 VITALS — BP 124/68 | HR 78 | Temp 97.0°F | Resp 17

## 2022-10-13 DIAGNOSIS — N183 Chronic kidney disease, stage 3 unspecified: Secondary | ICD-10-CM | POA: Diagnosis present

## 2022-10-13 LAB — POCT HEMOGLOBIN-HEMACUE: Hemoglobin: 10.9 g/dL — ABNORMAL LOW (ref 12.0–15.0)

## 2022-10-13 MED ORDER — EPOETIN ALFA-EPBX 10000 UNIT/ML IJ SOLN
INTRAMUSCULAR | Status: AC
  Filled 2022-10-13: qty 1

## 2022-10-13 MED ORDER — EPOETIN ALFA-EPBX 10000 UNIT/ML IJ SOLN
10000.0000 [IU] | INTRAMUSCULAR | Status: DC
  Administered 2022-10-13: 10000 [IU] via SUBCUTANEOUS

## 2022-10-26 ENCOUNTER — Other Ambulatory Visit (HOSPITAL_COMMUNITY): Payer: Self-pay

## 2022-10-26 DIAGNOSIS — N1832 Chronic kidney disease, stage 3b: Secondary | ICD-10-CM | POA: Diagnosis not present

## 2022-10-26 DIAGNOSIS — N2581 Secondary hyperparathyroidism of renal origin: Secondary | ICD-10-CM | POA: Diagnosis not present

## 2022-10-26 DIAGNOSIS — Z791 Long term (current) use of non-steroidal anti-inflammatories (NSAID): Secondary | ICD-10-CM | POA: Diagnosis not present

## 2022-10-26 DIAGNOSIS — I129 Hypertensive chronic kidney disease with stage 1 through stage 4 chronic kidney disease, or unspecified chronic kidney disease: Secondary | ICD-10-CM | POA: Diagnosis not present

## 2022-10-26 DIAGNOSIS — D631 Anemia in chronic kidney disease: Secondary | ICD-10-CM | POA: Diagnosis not present

## 2022-10-26 MED ORDER — AMLODIPINE BESYLATE 10 MG PO TABS
10.0000 mg | ORAL_TABLET | Freq: Every day | ORAL | 3 refills | Status: DC
  Filled 2022-10-26: qty 90, 90d supply, fill #0

## 2022-10-27 ENCOUNTER — Encounter (HOSPITAL_COMMUNITY)
Admission: RE | Admit: 2022-10-27 | Discharge: 2022-10-27 | Disposition: A | Discharge status: Home / Self Care | Payer: 59 | Source: Ambulatory Visit | Attending: Nephrology | Admitting: Nephrology

## 2022-10-27 VITALS — BP 123/70 | HR 83 | Temp 97.0°F | Resp 18

## 2022-10-27 DIAGNOSIS — N183 Chronic kidney disease, stage 3 unspecified: Secondary | ICD-10-CM

## 2022-10-27 LAB — IRON AND TIBC
Iron: 72 ug/dL (ref 28–170)
Saturation Ratios: 25 % (ref 10.4–31.8)
TIBC: 294 ug/dL (ref 250–450)
UIBC: 222 ug/dL

## 2022-10-27 LAB — FERRITIN: Ferritin: 573 ng/mL — ABNORMAL HIGH (ref 11–307)

## 2022-10-27 LAB — POCT HEMOGLOBIN-HEMACUE: Hemoglobin: 11.1 g/dL — ABNORMAL LOW (ref 12.0–15.0)

## 2022-10-27 MED ORDER — EPOETIN ALFA-EPBX 10000 UNIT/ML IJ SOLN
10000.0000 [IU] | INTRAMUSCULAR | Status: DC
  Administered 2022-10-27: 10000 [IU] via SUBCUTANEOUS

## 2022-10-27 MED ORDER — EPOETIN ALFA-EPBX 10000 UNIT/ML IJ SOLN
INTRAMUSCULAR | Status: AC
  Filled 2022-10-27: qty 1

## 2022-11-09 DIAGNOSIS — E038 Other specified hypothyroidism: Secondary | ICD-10-CM | POA: Diagnosis not present

## 2022-11-09 DIAGNOSIS — M25512 Pain in left shoulder: Secondary | ICD-10-CM | POA: Diagnosis not present

## 2022-11-09 DIAGNOSIS — D638 Anemia in other chronic diseases classified elsewhere: Secondary | ICD-10-CM | POA: Diagnosis not present

## 2022-11-09 DIAGNOSIS — Z0000 Encounter for general adult medical examination without abnormal findings: Secondary | ICD-10-CM | POA: Diagnosis not present

## 2022-11-09 DIAGNOSIS — E785 Hyperlipidemia, unspecified: Secondary | ICD-10-CM | POA: Diagnosis not present

## 2022-11-09 DIAGNOSIS — I129 Hypertensive chronic kidney disease with stage 1 through stage 4 chronic kidney disease, or unspecified chronic kidney disease: Secondary | ICD-10-CM | POA: Diagnosis not present

## 2022-11-09 DIAGNOSIS — K760 Fatty (change of) liver, not elsewhere classified: Secondary | ICD-10-CM | POA: Diagnosis not present

## 2022-11-09 DIAGNOSIS — G8929 Other chronic pain: Secondary | ICD-10-CM | POA: Diagnosis not present

## 2022-11-09 DIAGNOSIS — I700 Atherosclerosis of aorta: Secondary | ICD-10-CM | POA: Diagnosis not present

## 2022-11-09 DIAGNOSIS — N2581 Secondary hyperparathyroidism of renal origin: Secondary | ICD-10-CM | POA: Diagnosis not present

## 2022-11-10 ENCOUNTER — Encounter (HOSPITAL_COMMUNITY)
Admission: RE | Admit: 2022-11-10 | Discharge: 2022-11-10 | Disposition: A | Discharge status: Home / Self Care | Payer: 59 | Source: Ambulatory Visit | Attending: Nephrology | Admitting: Nephrology

## 2022-11-10 VITALS — BP 132/74 | HR 68 | Temp 97.7°F | Resp 17

## 2022-11-10 DIAGNOSIS — N183 Chronic kidney disease, stage 3 unspecified: Secondary | ICD-10-CM | POA: Diagnosis present

## 2022-11-10 MED ORDER — EPOETIN ALFA-EPBX 10000 UNIT/ML IJ SOLN
10000.0000 [IU] | INTRAMUSCULAR | Status: DC
  Administered 2022-11-10: 10000 [IU] via SUBCUTANEOUS

## 2022-11-10 MED ORDER — EPOETIN ALFA-EPBX 10000 UNIT/ML IJ SOLN
INTRAMUSCULAR | Status: AC
  Filled 2022-11-10: qty 1

## 2022-11-11 LAB — POCT HEMOGLOBIN-HEMACUE: Hemoglobin: 10 g/dL — ABNORMAL LOW (ref 12.0–15.0)

## 2022-11-24 ENCOUNTER — Encounter (HOSPITAL_COMMUNITY)
Admission: RE | Admit: 2022-11-24 | Discharge: 2022-11-24 | Disposition: A | Discharge status: Home / Self Care | Payer: 59 | Source: Ambulatory Visit | Attending: Nephrology | Admitting: Nephrology

## 2022-11-24 VITALS — BP 129/74 | HR 73 | Temp 97.3°F | Resp 17

## 2022-11-24 DIAGNOSIS — N183 Chronic kidney disease, stage 3 unspecified: Secondary | ICD-10-CM | POA: Diagnosis not present

## 2022-11-24 LAB — IRON AND TIBC
Iron: 60 ug/dL (ref 28–170)
Saturation Ratios: 21 % (ref 10.4–31.8)
TIBC: 291 ug/dL (ref 250–450)
UIBC: 231 ug/dL

## 2022-11-24 LAB — POCT HEMOGLOBIN-HEMACUE: Hemoglobin: 10.7 g/dL — ABNORMAL LOW (ref 12.0–15.0)

## 2022-11-24 LAB — FERRITIN: Ferritin: 455 ng/mL — ABNORMAL HIGH (ref 11–307)

## 2022-11-24 MED ORDER — EPOETIN ALFA-EPBX 10000 UNIT/ML IJ SOLN: 10000.0000 [IU] | INTRAMUSCULAR | Status: DC

## 2022-11-24 MED ORDER — EPOETIN ALFA-EPBX 10000 UNIT/ML IJ SOLN
INTRAMUSCULAR | Status: AC
  Administered 2022-11-24: 10000 [IU] via SUBCUTANEOUS
  Filled 2022-11-24: qty 1

## 2022-12-01 ENCOUNTER — Encounter (HOSPITAL_COMMUNITY): Payer: Self-pay

## 2022-12-01 ENCOUNTER — Other Ambulatory Visit (HOSPITAL_COMMUNITY): Payer: Self-pay

## 2022-12-01 MED ORDER — ROSUVASTATIN CALCIUM 10 MG PO TABS
10.0000 mg | ORAL_TABLET | Freq: Every day | ORAL | 0 refills | Status: DC
  Filled 2022-12-01: qty 30, 30d supply, fill #0

## 2022-12-08 ENCOUNTER — Encounter (HOSPITAL_COMMUNITY): Payer: 59

## 2022-12-08 ENCOUNTER — Other Ambulatory Visit (HOSPITAL_COMMUNITY): Payer: Self-pay

## 2022-12-22 ENCOUNTER — Encounter (HOSPITAL_COMMUNITY): Payer: 59

## 2023-02-03 ENCOUNTER — Other Ambulatory Visit: Payer: Self-pay | Admitting: Internal Medicine

## 2023-02-03 DIAGNOSIS — Z1231 Encounter for screening mammogram for malignant neoplasm of breast: Secondary | ICD-10-CM

## 2023-02-23 DIAGNOSIS — N2581 Secondary hyperparathyroidism of renal origin: Secondary | ICD-10-CM | POA: Diagnosis not present

## 2023-02-23 DIAGNOSIS — I129 Hypertensive chronic kidney disease with stage 1 through stage 4 chronic kidney disease, or unspecified chronic kidney disease: Secondary | ICD-10-CM | POA: Diagnosis not present

## 2023-02-23 DIAGNOSIS — Z791 Long term (current) use of non-steroidal anti-inflammatories (NSAID): Secondary | ICD-10-CM | POA: Diagnosis not present

## 2023-02-23 DIAGNOSIS — R809 Proteinuria, unspecified: Secondary | ICD-10-CM | POA: Diagnosis not present

## 2023-02-23 DIAGNOSIS — D631 Anemia in chronic kidney disease: Secondary | ICD-10-CM | POA: Diagnosis not present

## 2023-02-23 DIAGNOSIS — N1832 Chronic kidney disease, stage 3b: Secondary | ICD-10-CM | POA: Diagnosis not present

## 2023-03-04 ENCOUNTER — Other Ambulatory Visit (HOSPITAL_COMMUNITY): Payer: Self-pay

## 2023-03-04 MED ORDER — LOSARTAN POTASSIUM 25 MG PO TABS
25.0000 mg | ORAL_TABLET | Freq: Every day | ORAL | 3 refills | Status: AC
  Filled 2023-03-04: qty 30, 30d supply, fill #0

## 2023-03-05 ENCOUNTER — Other Ambulatory Visit (HOSPITAL_COMMUNITY): Payer: Self-pay

## 2023-03-12 DIAGNOSIS — E038 Other specified hypothyroidism: Secondary | ICD-10-CM | POA: Diagnosis not present

## 2023-03-12 DIAGNOSIS — E785 Hyperlipidemia, unspecified: Secondary | ICD-10-CM | POA: Diagnosis not present

## 2023-03-12 DIAGNOSIS — R748 Abnormal levels of other serum enzymes: Secondary | ICD-10-CM | POA: Diagnosis not present

## 2023-03-19 ENCOUNTER — Other Ambulatory Visit (HOSPITAL_COMMUNITY): Payer: Self-pay

## 2023-03-19 MED ORDER — LEVOTHYROXINE SODIUM 25 MCG PO TABS
25.0000 ug | ORAL_TABLET | Freq: Every morning | ORAL | 1 refills | Status: DC
  Filled 2023-03-19: qty 90, 90d supply, fill #0

## 2023-03-19 MED ORDER — ROSUVASTATIN CALCIUM 20 MG PO TABS
20.0000 mg | ORAL_TABLET | Freq: Every day | ORAL | 3 refills | Status: DC
  Filled 2023-03-19: qty 90, 90d supply, fill #0
  Filled 2023-08-26: qty 90, 90d supply, fill #1

## 2023-03-28 IMAGING — MG MM DIGITAL SCREENING BILAT W/ TOMO AND CAD
8 series · 8 of 24 positions shown · non-contrast
Comparison: Previous exam(s).

CLINICAL DATA: Screening.

EXAM:
DIGITAL SCREENING BILATERAL MAMMOGRAM WITH TOMOSYNTHESIS AND CAD
TECHNIQUE: Bilateral screening digital craniocaudal and mediolateral oblique
mammograms were obtained. Bilateral screening digital breast
tomosynthesis was performed. The images were evaluated with
computer-aided detection.

[L MLO synth-2D]
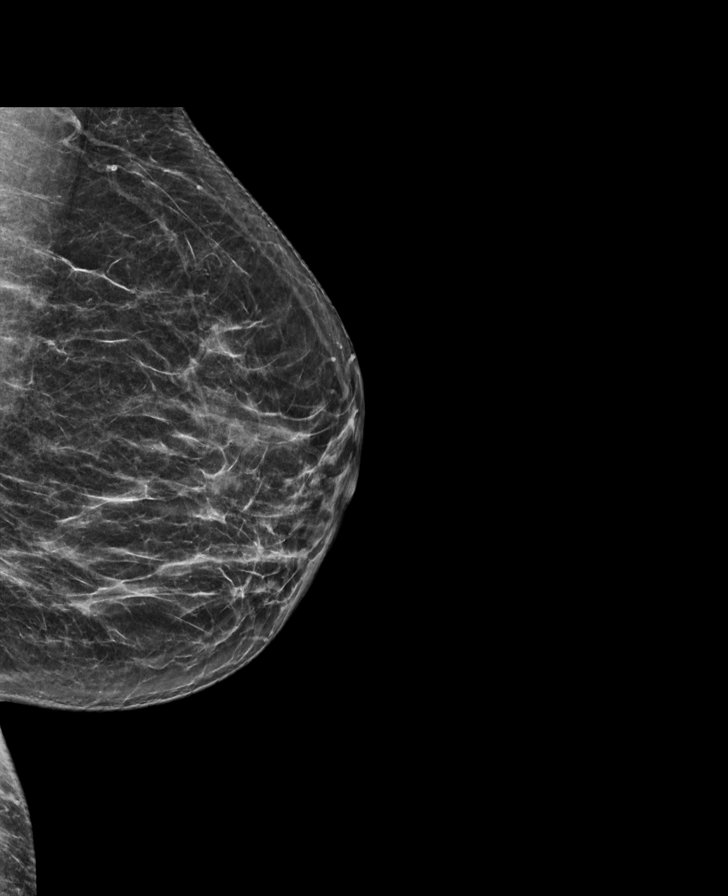

[R MLO synth-2D]
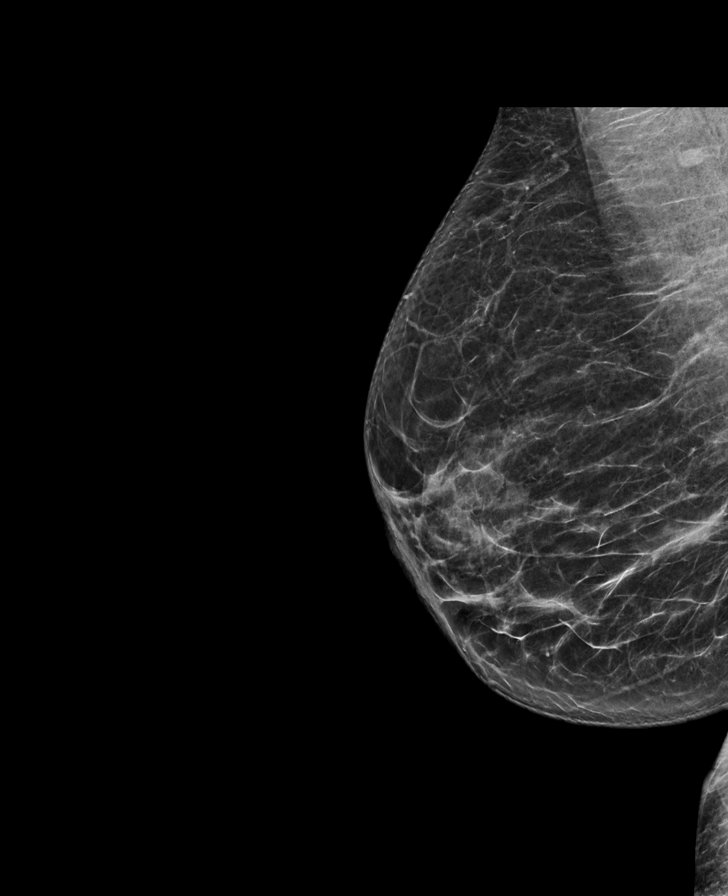

[R CC synth-2D]
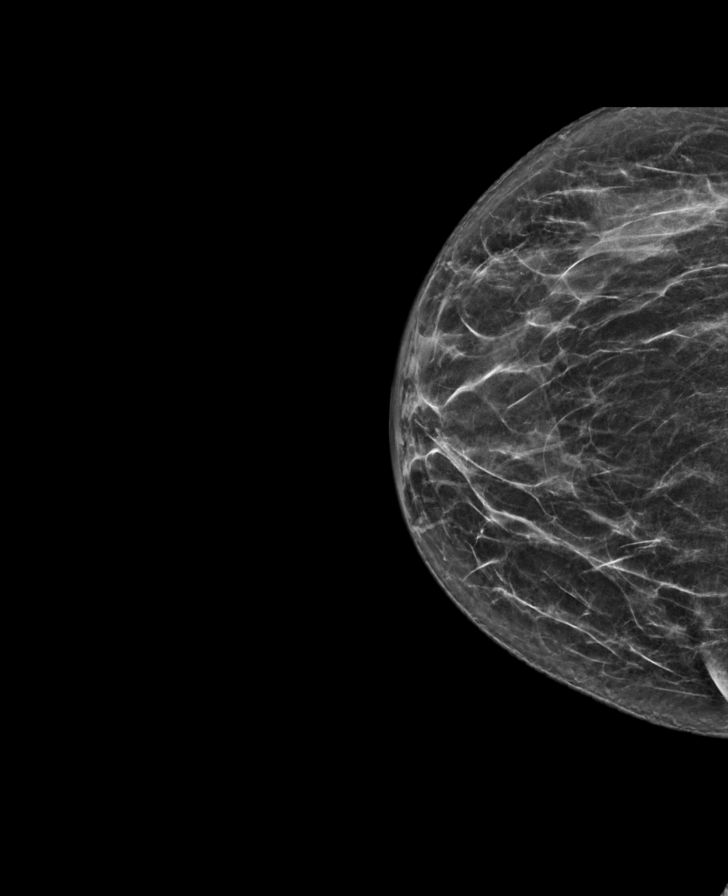

[L CC synth-2D]
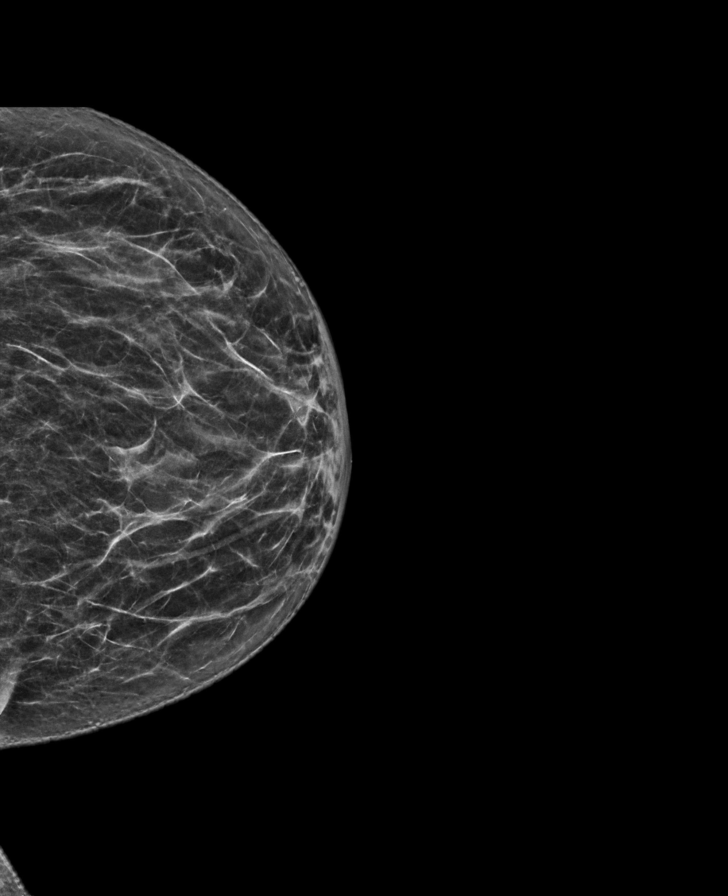

[L CC tomo · tomo slice 30/59.0]
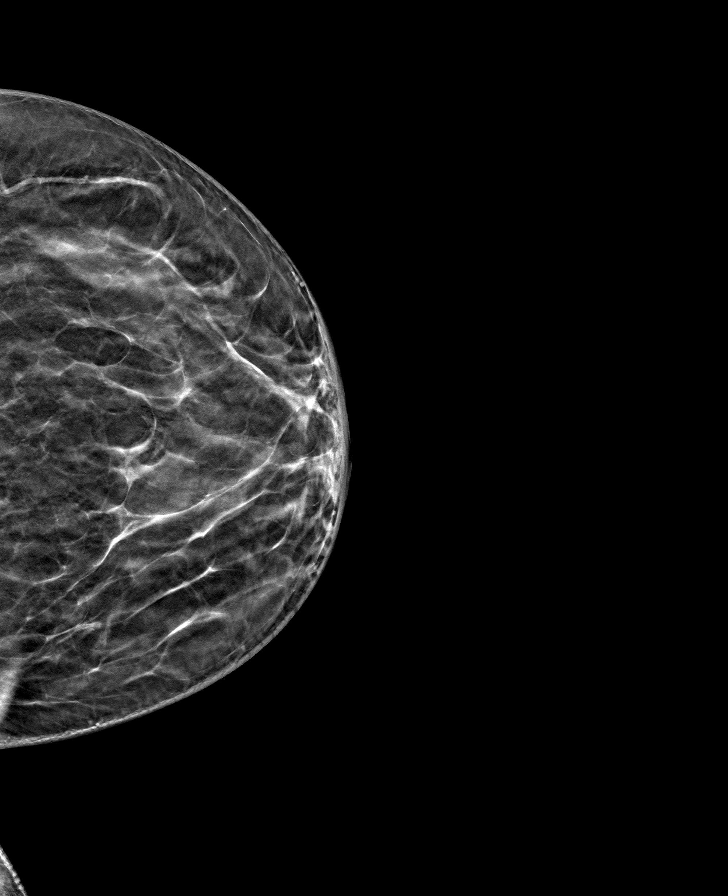

[R CC tomo · tomo slice 31/62.0]
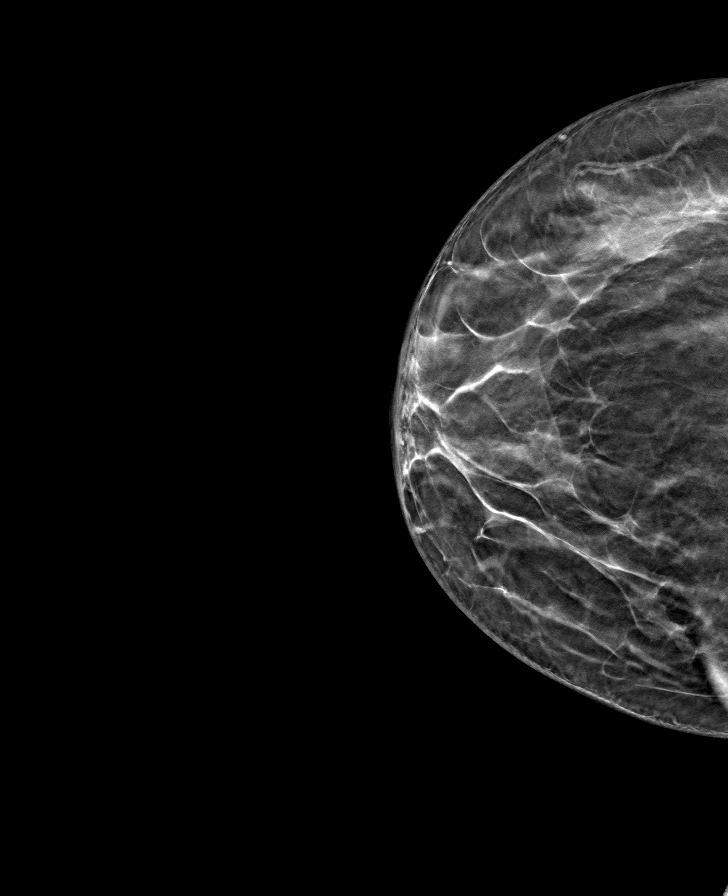

[L MLO tomo · tomo slice 32/63.0]
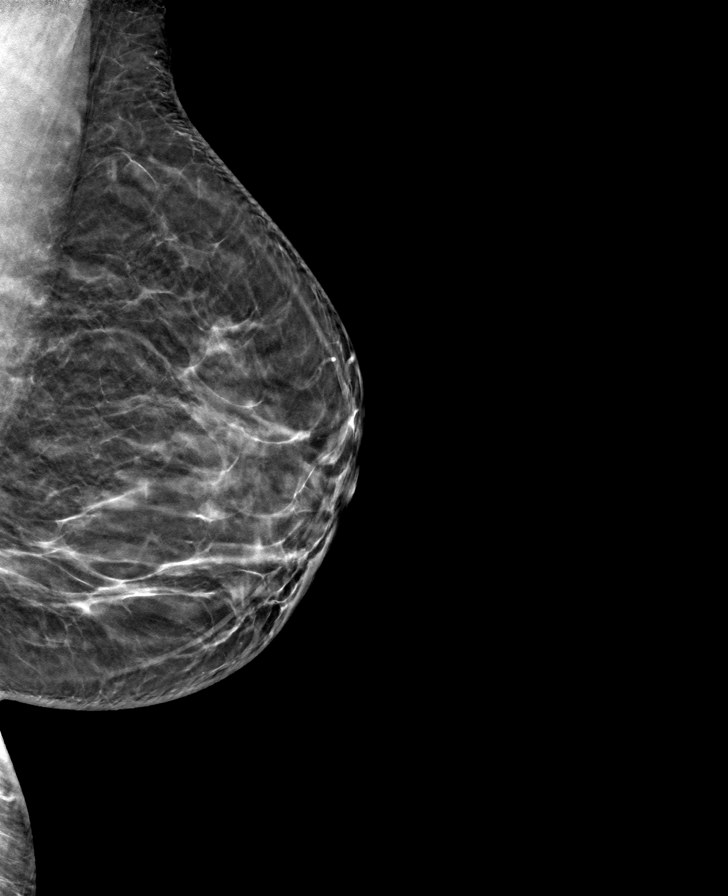

[R MLO tomo · tomo slice 35/68.0]
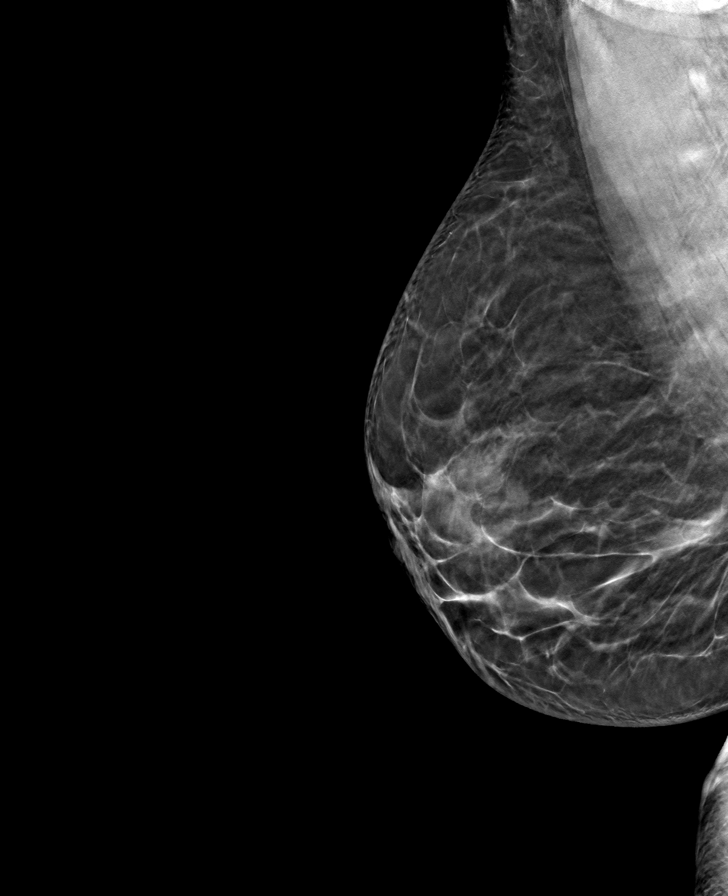

[8 of 24 positions shown; findings below may reference images not displayed]

ACR Breast Density Category b: There are scattered areas of
fibroglandular density.
FINDINGS: There are no findings suspicious for malignancy.
IMPRESSION: No mammographic evidence of malignancy. A result letter of this
screening mammogram will be mailed directly to the patient.

RECOMMENDATION:
Screening mammogram in one year. (Code:51-O-LD2)

BI-RADS CATEGORY  1: Negative.

## 2023-04-05 IMAGING — US US RENAL
1 series · 14 of 25 positions shown · non-contrast
Comparison: none

CLINICAL DATA: Chronic kidney disease

EXAM:
RENAL / URINARY TRACT ULTRASOUND COMPLETE

[Series 1: us renal · 0.20mm/px · 14 of 28 slices shown]
[im 1/28]
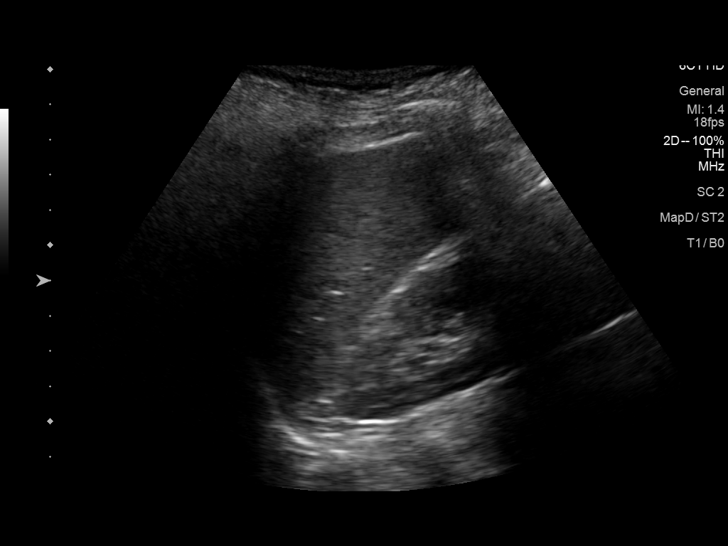
[im 3/28]
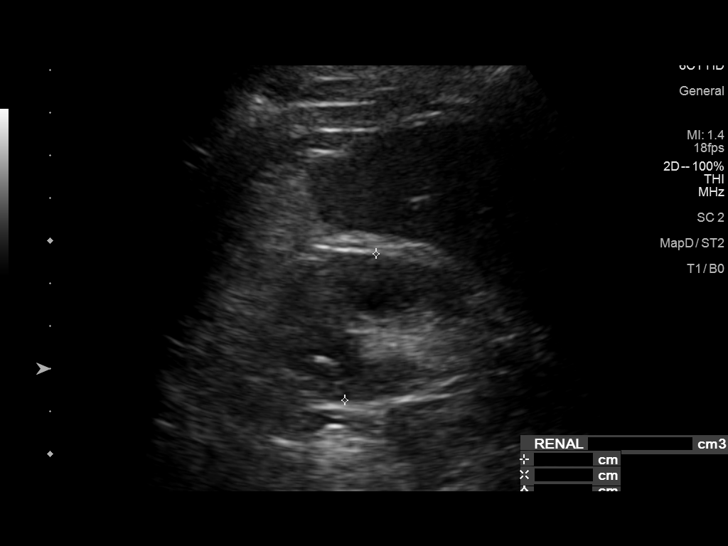
[im 5/28]
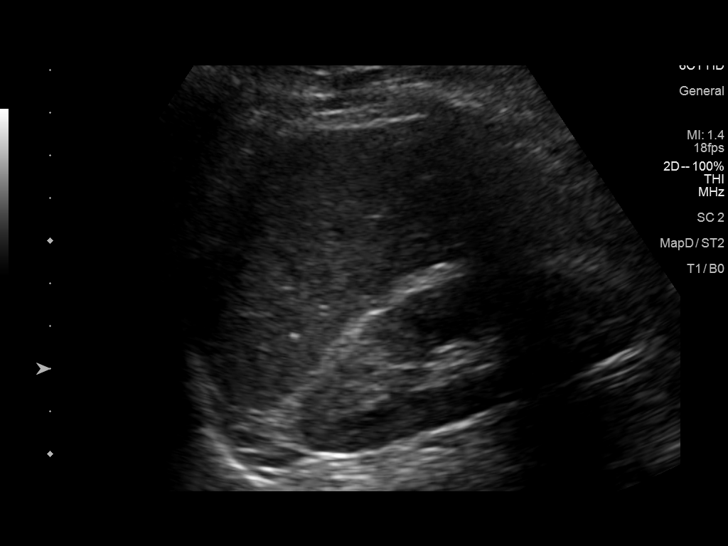
[im 7/28]
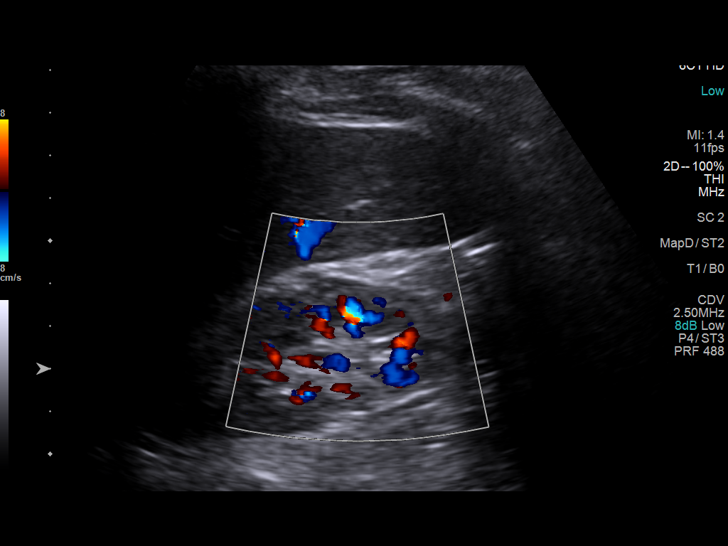
[im 10/28]
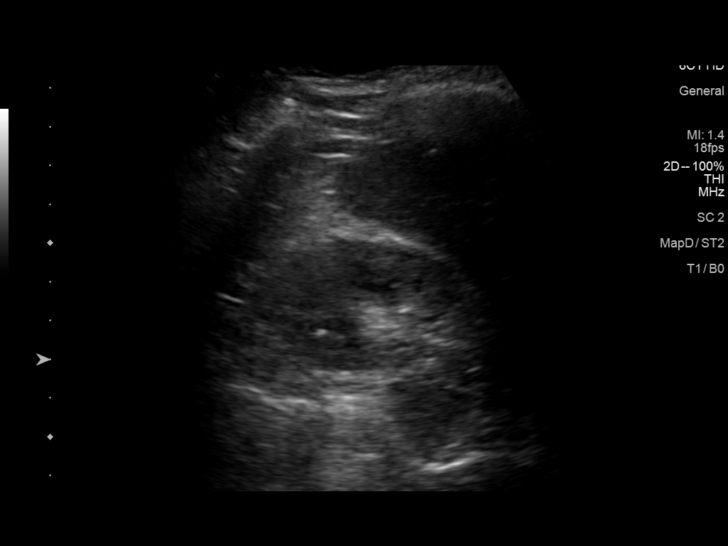
[im 11/28]
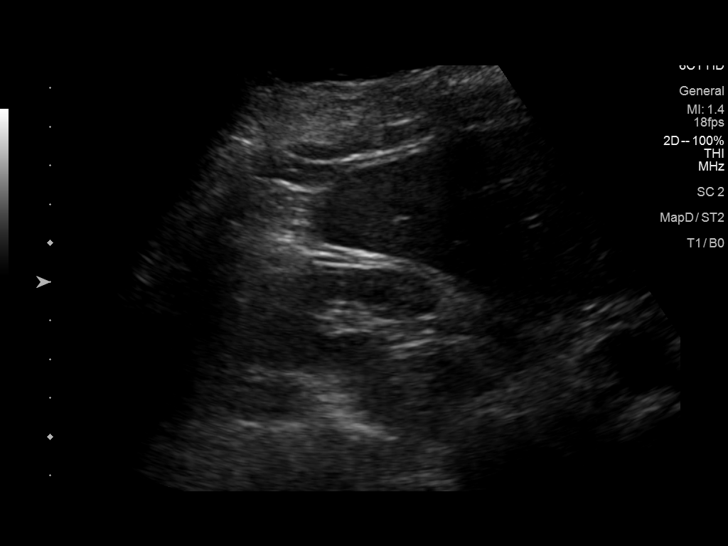
[im 13/28]
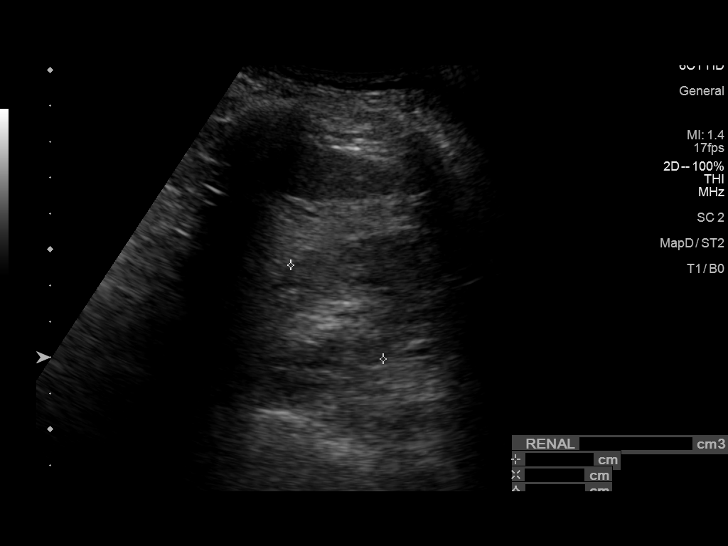
[im 15/28]
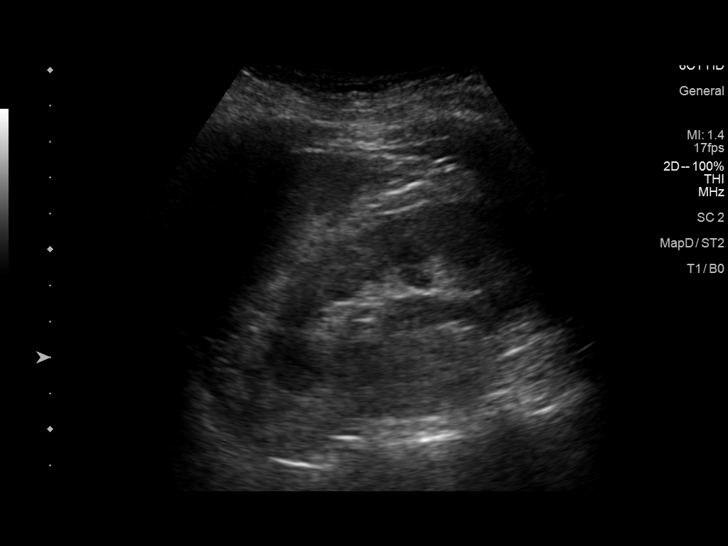
[im 17/28]
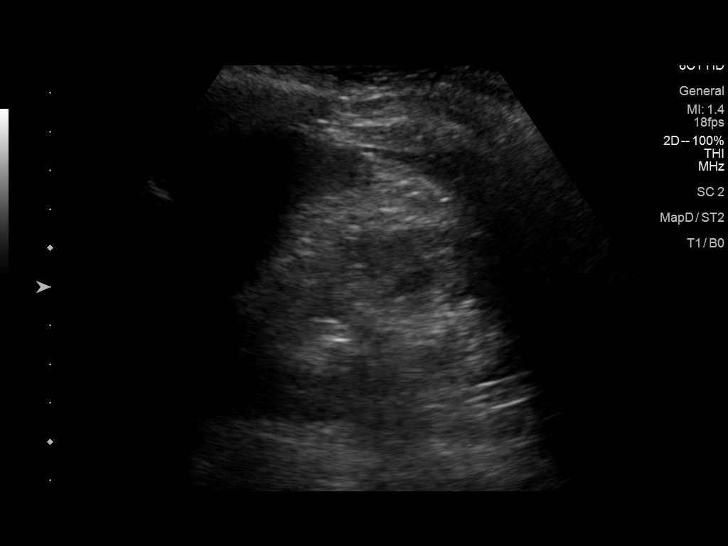
[im 19/28]
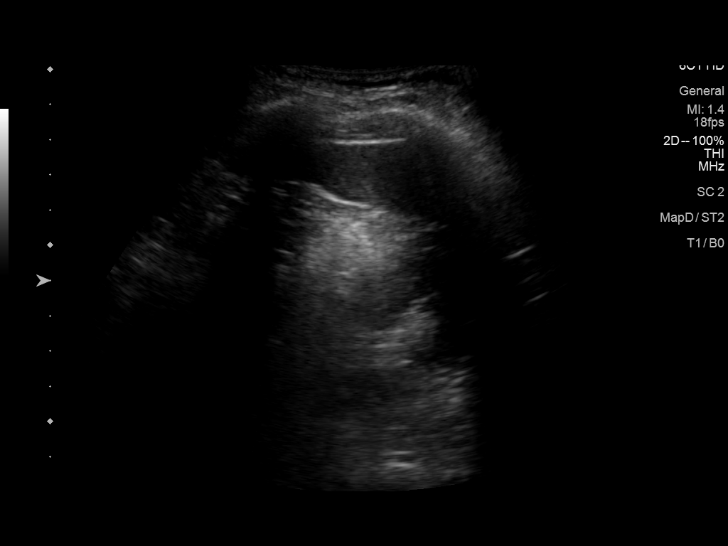
[im 21/28]
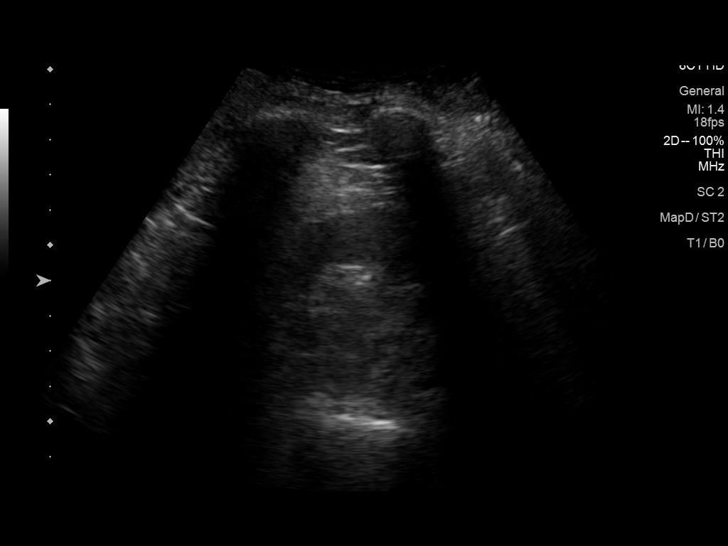
[im 23/28]
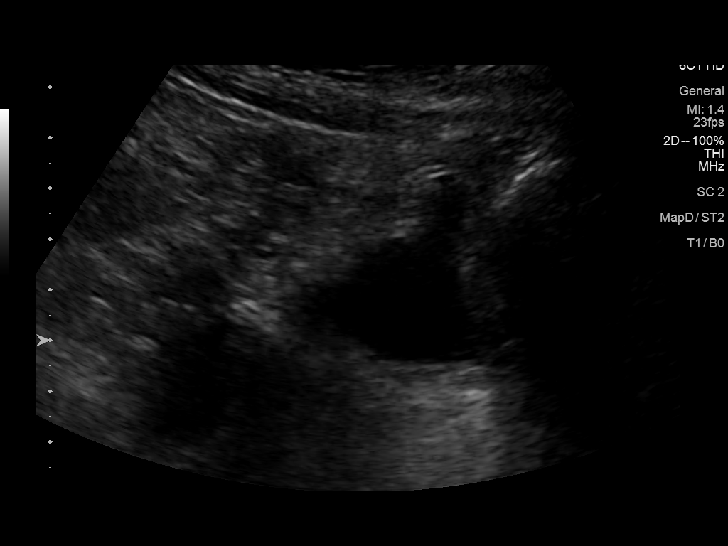
[im 25/28]
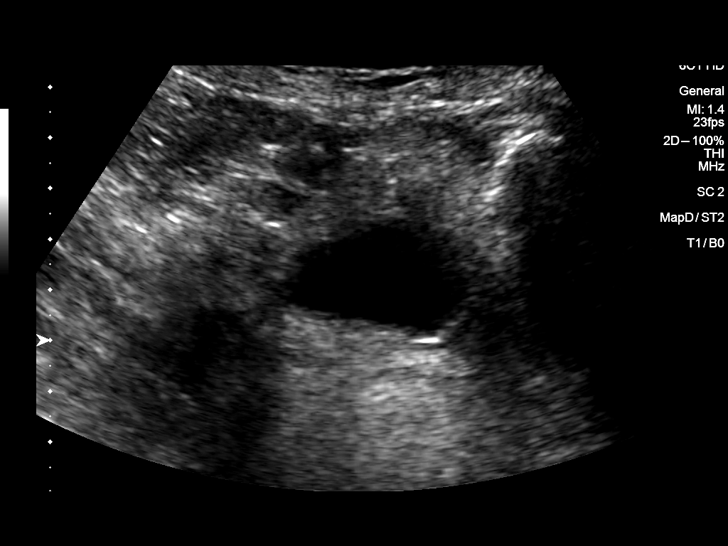
[im 28/28]
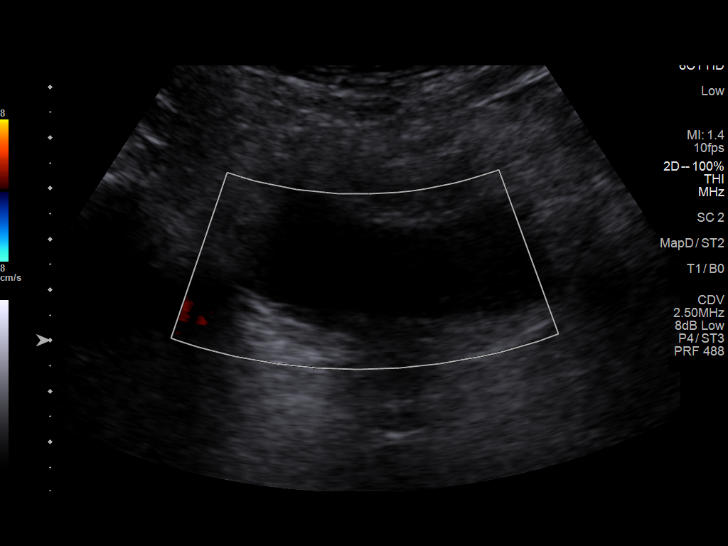

[14 of 25 positions shown; findings below may reference images not displayed]

FINDINGS: Right Kidney:

Renal measurements: 9.6 x 3.2 x 3.5 cm = volume: 57 mL. Echogenicity
within normal limits. No mass or hydronephrosis visualized.

Left Kidney:

Renal measurements: 11.1 x 5.2 x 3.7 cm = volume: 111 mL.
Echogenicity within normal limits. No mass or hydronephrosis
visualized.

Bladder:

Appears normal for degree of bladder distention.

Other:

None.
IMPRESSION: Normal appearance of the bilateral kidneys.

## 2023-04-16 DIAGNOSIS — E039 Hypothyroidism, unspecified: Secondary | ICD-10-CM | POA: Diagnosis not present

## 2023-04-19 ENCOUNTER — Other Ambulatory Visit (HOSPITAL_COMMUNITY): Payer: Self-pay

## 2023-04-19 MED ORDER — LEVOTHYROXINE SODIUM 50 MCG PO TABS
50.0000 ug | ORAL_TABLET | Freq: Every morning | ORAL | 1 refills | Status: DC
  Filled 2023-04-19: qty 90, 90d supply, fill #0
  Filled 2023-08-26: qty 90, 90d supply, fill #1

## 2023-05-17 DIAGNOSIS — K760 Fatty (change of) liver, not elsewhere classified: Secondary | ICD-10-CM | POA: Diagnosis not present

## 2023-06-01 DIAGNOSIS — N1832 Chronic kidney disease, stage 3b: Secondary | ICD-10-CM | POA: Diagnosis not present

## 2023-06-01 DIAGNOSIS — N189 Chronic kidney disease, unspecified: Secondary | ICD-10-CM | POA: Diagnosis not present

## 2023-06-01 DIAGNOSIS — R809 Proteinuria, unspecified: Secondary | ICD-10-CM | POA: Diagnosis not present

## 2023-06-01 DIAGNOSIS — I129 Hypertensive chronic kidney disease with stage 1 through stage 4 chronic kidney disease, or unspecified chronic kidney disease: Secondary | ICD-10-CM | POA: Diagnosis not present

## 2023-06-01 DIAGNOSIS — N2581 Secondary hyperparathyroidism of renal origin: Secondary | ICD-10-CM | POA: Diagnosis not present

## 2023-06-01 DIAGNOSIS — D631 Anemia in chronic kidney disease: Secondary | ICD-10-CM | POA: Diagnosis not present

## 2023-06-04 DIAGNOSIS — Z01419 Encounter for gynecological examination (general) (routine) without abnormal findings: Secondary | ICD-10-CM | POA: Diagnosis not present

## 2023-06-14 DIAGNOSIS — E038 Other specified hypothyroidism: Secondary | ICD-10-CM | POA: Diagnosis not present

## 2023-06-14 DIAGNOSIS — R7989 Other specified abnormal findings of blood chemistry: Secondary | ICD-10-CM | POA: Diagnosis not present

## 2023-06-14 DIAGNOSIS — R946 Abnormal results of thyroid function studies: Secondary | ICD-10-CM | POA: Diagnosis not present

## 2023-06-24 DIAGNOSIS — E785 Hyperlipidemia, unspecified: Secondary | ICD-10-CM | POA: Diagnosis not present

## 2023-07-19 ENCOUNTER — Ambulatory Visit
Admission: RE | Admit: 2023-07-19 | Discharge: 2023-07-19 | Disposition: A | Discharge status: Home / Self Care | Payer: 59 | Source: Ambulatory Visit | Attending: Internal Medicine | Admitting: Internal Medicine

## 2023-07-19 DIAGNOSIS — Z1231 Encounter for screening mammogram for malignant neoplasm of breast: Secondary | ICD-10-CM | POA: Diagnosis not present

## 2023-08-25 DIAGNOSIS — M898X9 Other specified disorders of bone, unspecified site: Secondary | ICD-10-CM | POA: Diagnosis not present

## 2023-08-25 DIAGNOSIS — E785 Hyperlipidemia, unspecified: Secondary | ICD-10-CM | POA: Diagnosis not present

## 2023-08-25 DIAGNOSIS — M545 Low back pain, unspecified: Secondary | ICD-10-CM | POA: Diagnosis not present

## 2023-08-25 DIAGNOSIS — M62838 Other muscle spasm: Secondary | ICD-10-CM | POA: Diagnosis not present

## 2023-08-25 DIAGNOSIS — I129 Hypertensive chronic kidney disease with stage 1 through stage 4 chronic kidney disease, or unspecified chronic kidney disease: Secondary | ICD-10-CM | POA: Diagnosis not present

## 2023-08-26 ENCOUNTER — Other Ambulatory Visit (HOSPITAL_COMMUNITY): Payer: Self-pay

## 2023-08-26 ENCOUNTER — Other Ambulatory Visit: Payer: Self-pay

## 2023-08-26 MED ORDER — AMLODIPINE BESYLATE 10 MG PO TABS
10.0000 mg | ORAL_TABLET | Freq: Every day | ORAL | 3 refills | Status: DC
  Filled 2023-08-26: qty 90, 90d supply, fill #0

## 2023-08-26 MED ORDER — METHOCARBAMOL 500 MG PO TABS
500.0000 mg | ORAL_TABLET | Freq: Three times a day (TID) | ORAL | 0 refills | Status: AC | PRN
  Filled 2023-08-26: qty 30, 10d supply, fill #0

## 2023-08-30 ENCOUNTER — Other Ambulatory Visit (HOSPITAL_COMMUNITY): Payer: Self-pay

## 2023-10-21 ENCOUNTER — Encounter (HOSPITAL_COMMUNITY): Payer: Self-pay

## 2023-10-28 ENCOUNTER — Telehealth: Payer: Self-pay

## 2023-10-28 ENCOUNTER — Ambulatory Visit: Payer: 59

## 2023-10-28 DIAGNOSIS — E039 Hypothyroidism, unspecified: Secondary | ICD-10-CM

## 2023-10-28 NOTE — Telephone Encounter (Signed)
 Orders Placed This Encounter  Procedures   TSH   T4, free   T3, free

## 2023-11-02 ENCOUNTER — Ambulatory Visit: Payer: 59 | Admitting: "Endocrinology

## 2023-11-15 ENCOUNTER — Encounter (HOSPITAL_COMMUNITY): Payer: Self-pay

## 2023-11-15 ENCOUNTER — Other Ambulatory Visit (HOSPITAL_COMMUNITY): Payer: Self-pay

## 2023-11-15 DIAGNOSIS — Z124 Encounter for screening for malignant neoplasm of cervix: Secondary | ICD-10-CM | POA: Diagnosis not present

## 2023-11-15 DIAGNOSIS — D631 Anemia in chronic kidney disease: Secondary | ICD-10-CM | POA: Diagnosis not present

## 2023-11-15 DIAGNOSIS — E785 Hyperlipidemia, unspecified: Secondary | ICD-10-CM | POA: Diagnosis not present

## 2023-11-15 DIAGNOSIS — I129 Hypertensive chronic kidney disease with stage 1 through stage 4 chronic kidney disease, or unspecified chronic kidney disease: Secondary | ICD-10-CM | POA: Diagnosis not present

## 2023-11-15 DIAGNOSIS — E038 Other specified hypothyroidism: Secondary | ICD-10-CM | POA: Diagnosis not present

## 2023-11-15 MED ORDER — ROSUVASTATIN CALCIUM 20 MG PO TABS
20.0000 mg | ORAL_TABLET | Freq: Every day | ORAL | 3 refills | Status: AC
  Filled 2023-11-15: qty 90, 90d supply, fill #0

## 2023-11-16 ENCOUNTER — Other Ambulatory Visit: Payer: Self-pay | Admitting: Internal Medicine

## 2023-11-16 DIAGNOSIS — Z13820 Encounter for screening for osteoporosis: Secondary | ICD-10-CM

## 2023-11-19 ENCOUNTER — Other Ambulatory Visit (HOSPITAL_COMMUNITY): Payer: Self-pay

## 2023-11-19 MED ORDER — LEVOTHYROXINE SODIUM 50 MCG PO TABS
50.0000 ug | ORAL_TABLET | Freq: Every morning | ORAL | 1 refills | Status: AC
  Filled 2023-11-19: qty 90, 90d supply, fill #0

## 2023-11-23 ENCOUNTER — Ambulatory Visit
Admission: RE | Admit: 2023-11-23 | Discharge: 2023-11-23 | Disposition: A | Discharge status: Home / Self Care | Source: Ambulatory Visit | Attending: Internal Medicine | Admitting: Internal Medicine

## 2023-11-23 DIAGNOSIS — N958 Other specified menopausal and perimenopausal disorders: Secondary | ICD-10-CM | POA: Diagnosis not present

## 2023-11-23 DIAGNOSIS — E2839 Other primary ovarian failure: Secondary | ICD-10-CM | POA: Diagnosis not present

## 2023-11-23 DIAGNOSIS — Z13820 Encounter for screening for osteoporosis: Secondary | ICD-10-CM

## 2023-12-04 NOTE — Progress Notes (Unsigned)
 Cardiology Office Note:  .   Date:  12/08/2023  ID:  Traci Brown, DOB 25-Feb-1956, MRN [id number] PCP: Maurice Small, MD (Inactive)  [facility] Providers Cardiologist:  Rollene Rotunda, MD    Patient Profile: .      PMH SVT S/p ablation June 2021 Hypertension Familial hyperlipidemia CKD stage III Aortic atherosclerosis  Referred to advanced lipid disorder clinic and seen by Dr. Rennis Golden 04/02/2021.  She has a history of very high cholesterol and is followed by Dr. Antoine Poche for general cardiology and [name] for SVT status post ablation June 2021.  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  She fled [place] during conflict at which time unfortunately her parents and her husband were killed.  She has 4 grown daughters.  February 2022 total cholesterol was 431, HDL 40, triglycerides 354, and LDL 306.  Findings suggestive of familial combined hyperlipidemia or possibly a combination of FH/dysbetalipoproteinemia.  At the time of referral she was not taking any lipid-lowering medications.  She had reflux symptoms on atorvastatin.  She had negative stress test and CTA of chest in 2016 not remarkable for any coronary calcium.  She was advised to start rosuvastatin 20 mg daily.  LP(a) was negative.  Genetic testing revealed no "pathologic" variant, however she had multiple variants of unknown significance including mutations in APO E (E2/A3), LDL receptor, LPL CETP, L IPG, 2 mutations in the LPA gene and 2 mutations affecting obesity and 1 affecting endothelial dysfunction.  Based on this he qualified her as a compound heterozygote and at significantly increased risk of heart disease.  NMR 08/21/2021 revealed LDL particle number 1817, LDL-C 135, HDL-C 42, triglycerides 168, total cholesterol 207, and small LDL-P 1384. She was advised to add Repatha in addition to rosuvastatin 20 mg daily and return in 3-4 months for follow-up testing.        History of Present Illness: .   Traci Brown is a very pleasant 68 y.o. female who is here today for follow-up of dyslipidemia.  She reports she is feeling well.  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 There is some confusion about Repatha. She does not think she ever started the medication.  She is unsure why she did not follow-up until now.  She is concerned about recent lipid panel that revealed total cholesterol 415, HDL 42, LDL 331, and triglycerides 192.  She reports she is eating a healthier diet of traditional African food which is low in calories and fat. She reports she is active at work as well as walking in her yard.  She has occasional chest soreness that improves when she rubs it.  This is most noticeable when moving heavy patients at work.  She denies shortness of breath, orthopnea, PND, palpitations, presyncope, or syncope.   Discussed the use of AI scribe software for clinical note transcription with the patient, who gave verbal consent to proceed.   ROS: See HPI       Studies Reviewed: Marland Kitchen   EKG Interpretation Date/Time:  Wednesday December 08 2023 15:33:26 EDT Ventricular Rate:  75 PR Interval:  200 QRS Duration:  82 QT Interval:  412 QTC Calculation: 460 R Axis:   81  Text Interpretation: Normal sinus rhythm Normal ECG When compared with ECG of 06-Feb-2020 11:50, QRS axis Shifted right Confirmed by Eligha Bridegroom [phone]) on 12/08/2023 3:48:00 PM     Lipoprotein (a)  Date/Time Value Ref Range Status  08/21/2021 04:26 PM 66.0 <75.0 nmol/L  Final    Comment:    Note:  Values greater than or equal to 75.0 nmol/L may        indicate an independent risk factor for CHD,        but must be evaluated with caution when applied        to non-Caucasian populations due to the        influence of genetic factors on Lp(a) across        ethnicities.      Risk Assessment/Calculations:             Physical Exam:   VS:  BP 126/82 (BP Location: Left Arm, Patient Position: Sitting, Cuff Size: Normal)   Pulse  91   Ht 5\' 9"  (1.753 m)   Wt 194 lb (88 kg)   BMI 28.65 kg/m    Wt Readings from Last 3 Encounters:  12/08/23 194 lb (88 kg)  11/05/21 195 lb (88.5 kg)  08/21/21 193 lb 12.8 oz (87.9 kg)    GEN: Well nourished, well developed in no acute distress NECK: No JVD; No carotid bruits CARDIAC: RRR, no murmurs, rubs, gallops RESPIRATORY:  Clear to auscultation without rales, wheezing or rhonchi  ABDOMEN: Soft, non-tender, non-distended EXTREMITIES:  No edema; No deformity     ASSESSMENT AND PLAN: .    Familial hyperlipidemia LDL goal < 70: Lipid panel completed 11/15/2023 reveals total cholesterol 415, HDL 42, LDL 331, and triglycerides 192.  Was previously prescribed Repatha but does not think she ever started it. Reports compliance with rosuvastatin 20 mg daily.  Lengthy discussion about adding bempedoic acid or PCSK9 inhibitor.  She would prefer not giving herself an injection and would like to start with Nexletol if approved by insurance. Continue rosuvastatin. We will see her back in 3 months with repeat lipid testing prior.   Aortic atherosclerosis/cardiac risk assessment: Negative stress test in 2016. EKG today reveals normal sinus rhythm.  She denies chest pain, dyspnea, or other symptoms concerning for angina.  No indication for further ischemic evaluation at this time.  Will add Nexletol 180 mg daily in addition to rosuvastatin 20 mg daily for LDL goal 70 or lower.  Hypertension: BP is well-controlled.  Management per PCP.       Disposition:3 months with  me  Signed, Eligha Bridegroom, NP-C

## 2023-12-08 ENCOUNTER — Ambulatory Visit (INDEPENDENT_AMBULATORY_CARE_PROVIDER_SITE_OTHER): Payer: 59 | Admitting: Nurse Practitioner

## 2023-12-08 ENCOUNTER — Other Ambulatory Visit (HOSPITAL_COMMUNITY): Payer: Self-pay

## 2023-12-08 ENCOUNTER — Encounter (HOSPITAL_BASED_OUTPATIENT_CLINIC_OR_DEPARTMENT_OTHER): Payer: Self-pay | Admitting: Nurse Practitioner

## 2023-12-08 VITALS — BP 126/82 | HR 91 | Ht 69.0 in | Wt 194.0 lb

## 2023-12-08 DIAGNOSIS — I700 Atherosclerosis of aorta: Secondary | ICD-10-CM

## 2023-12-08 DIAGNOSIS — I10 Essential (primary) hypertension: Secondary | ICD-10-CM | POA: Diagnosis not present

## 2023-12-08 DIAGNOSIS — E785 Hyperlipidemia, unspecified: Secondary | ICD-10-CM

## 2023-12-08 DIAGNOSIS — Z7189 Other specified counseling: Secondary | ICD-10-CM | POA: Diagnosis not present

## 2023-12-08 DIAGNOSIS — E7801 Familial hypercholesterolemia: Secondary | ICD-10-CM

## 2023-12-08 MED ORDER — NEXLETOL 180 MG PO TABS
180.0000 mg | ORAL_TABLET | Freq: Every day | ORAL | 11 refills | Status: AC
  Filled 2023-12-08: qty 30, 30d supply, fill #0

## 2023-12-08 NOTE — Patient Instructions (Addendum)
 Medication Instructions:   START Nexlitol one (1) tablet by mouth ( 180 mg) daily.   *If you need a refill on your cardiac medications before your next appointment, please call your pharmacy*  Lab Work:  Your physician recommends that you return for a FASTING NMR, after midnight. In 3 months one week prior to your follow appointment on July 14. Pt given paperwork today.    If you have labs (blood work) drawn today and your tests are completely normal, you will receive your results only by: MyChart Message (if you have MyChart) OR A paper copy in the mail If you have any lab test that is abnormal or we need to change your treatment, we will call you to review the results.  Testing/Procedures:  None ordered.   Follow-Up: At [facility], you and your health needs are our priority.  As part of our continuing mission to provide you with exceptional heart care, our providers are all part of one team.  This team includes your primary Cardiologist (physician) and Advanced Practice Providers or APPs (Physician Assistants and Nurse Practitioners) who all work together to provide you with the care you need, when you need it.  Your next appointment:   3 month(s)  Provider:   Eligha Bridegroom, NP    We recommend signing up for the patient portal called "MyChart".  Sign up information is provided on this After Visit Summary.  MyChart is used to connect with patients for Virtual Visits (Telemedicine).  Patients are able to view lab/test results, encounter notes, upcoming appointments, etc.  Non-urgent messages can be sent to your provider as well.   To learn more about what you can do with MyChart, go to [email or web address].

## 2023-12-13 ENCOUNTER — Ambulatory Visit (HOSPITAL_BASED_OUTPATIENT_CLINIC_OR_DEPARTMENT_OTHER): Payer: 59 | Admitting: Nurse Practitioner

## 2023-12-22 ENCOUNTER — Other Ambulatory Visit

## 2023-12-29 ENCOUNTER — Ambulatory Visit: Admitting: "Endocrinology

## 2024-01-10 DIAGNOSIS — E038 Other specified hypothyroidism: Secondary | ICD-10-CM | POA: Diagnosis not present

## 2024-01-10 DIAGNOSIS — E785 Hyperlipidemia, unspecified: Secondary | ICD-10-CM | POA: Diagnosis not present

## 2024-01-25 IMAGING — CT CT ABD-PELV W/O CM
2 of 4 series · 15 of 46 positions shown, 17 images · non-contrast
Comparison: CT abdomen 03/24/2004

CLINICAL DATA: Right flank pain for 3 weeks.



[Series 2: routine abdomen pelvis without 5.00 br40 s3 axial · axial · non-contrast · 0.66mm/px · z∈[+1179,+1579]mm · 12 of 88 slices shown, 14 images]
[im 4/88  soft-tissue]
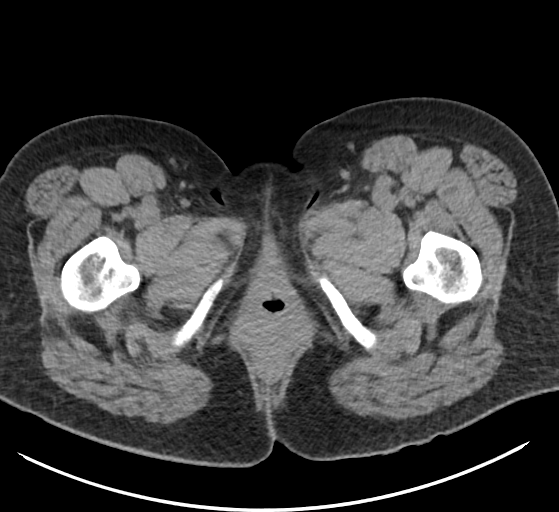
[im 4/88  bone]
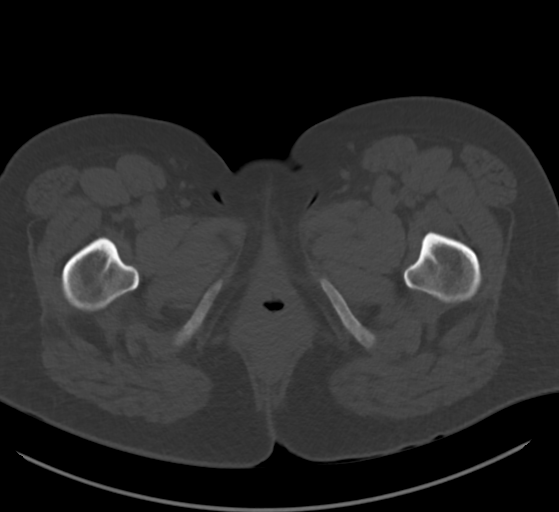
[im 12/88  soft-tissue]
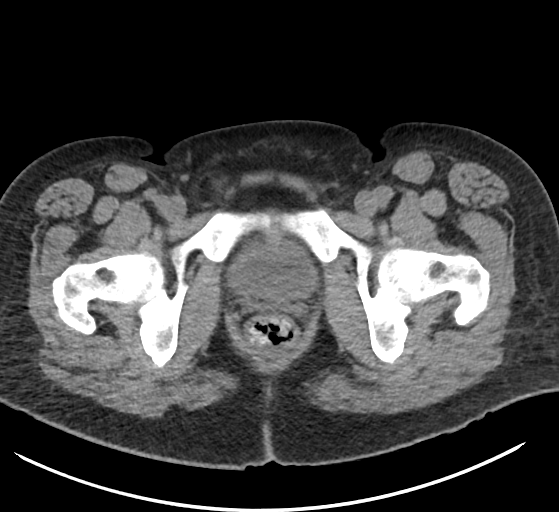
[im 20/88  soft-tissue]
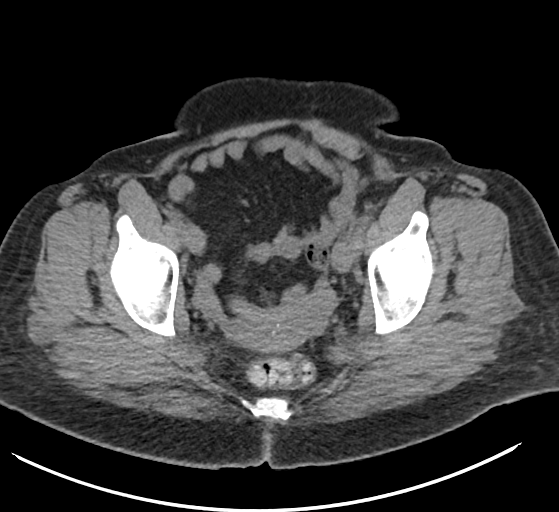
[im 28/88  soft-tissue]
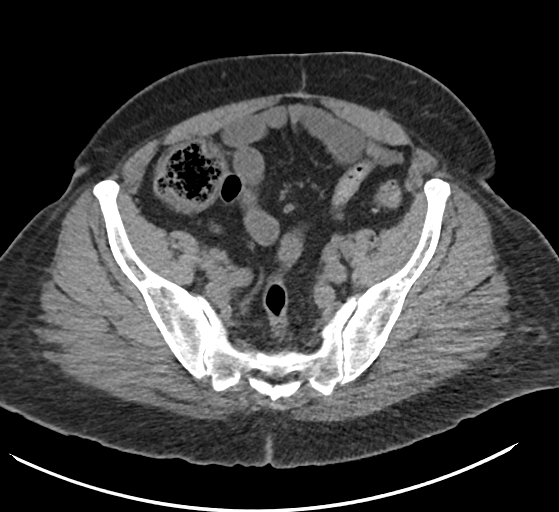
[im 32/88  soft-tissue]
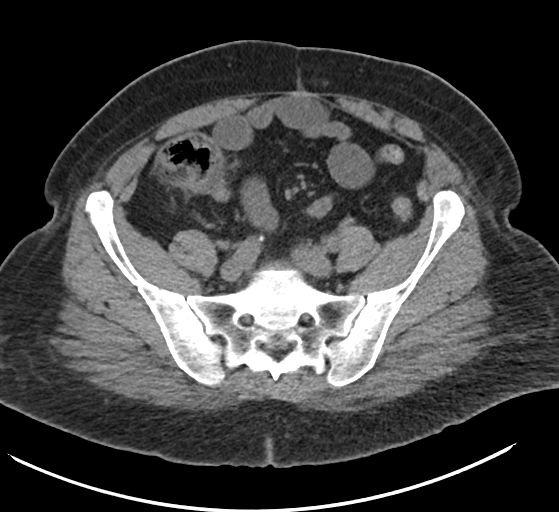
[im 40/88  soft-tissue]
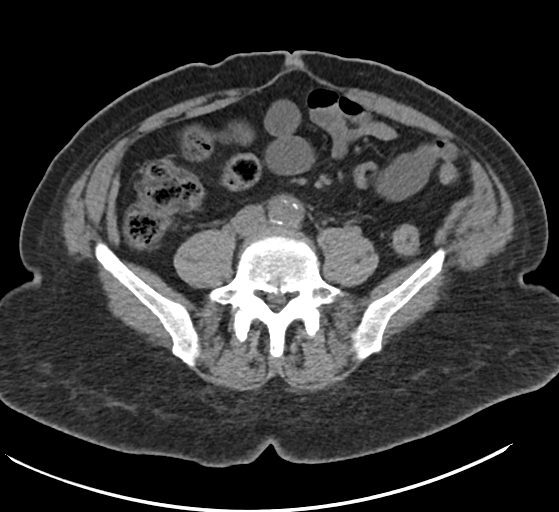
[im 48/88  soft-tissue]
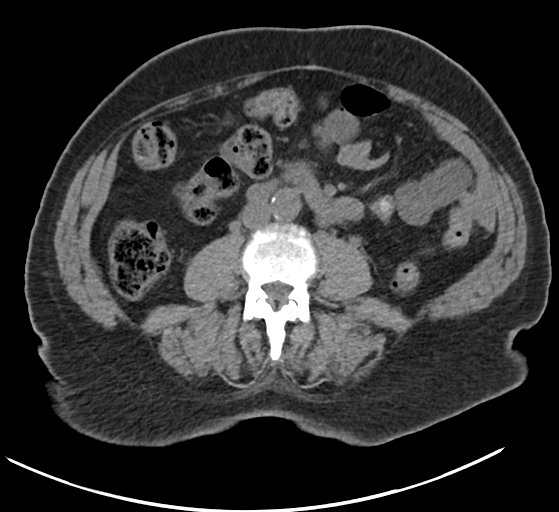
[im 56/88  soft-tissue]
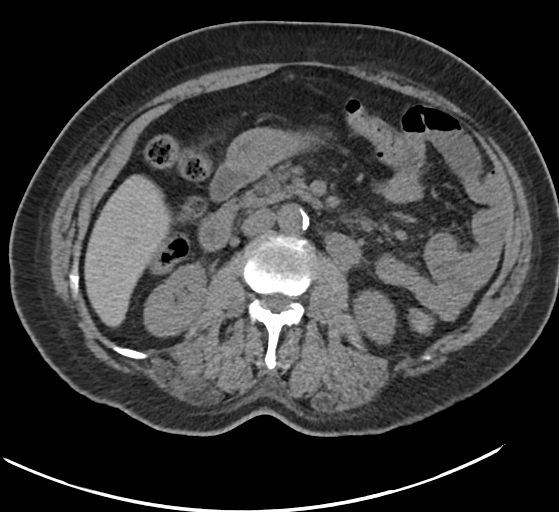
[im 60/88  soft-tissue]
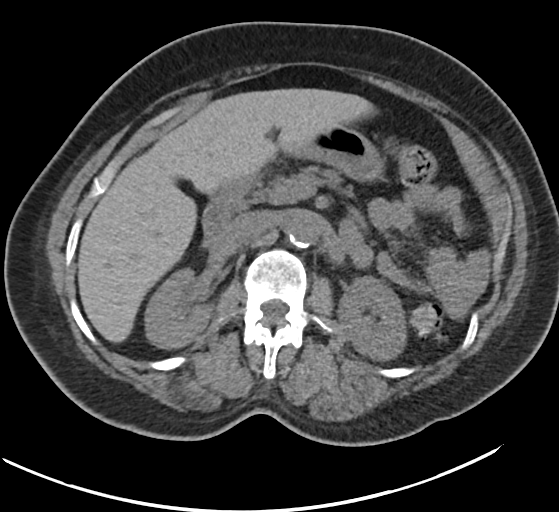
[im 60/88  bone]
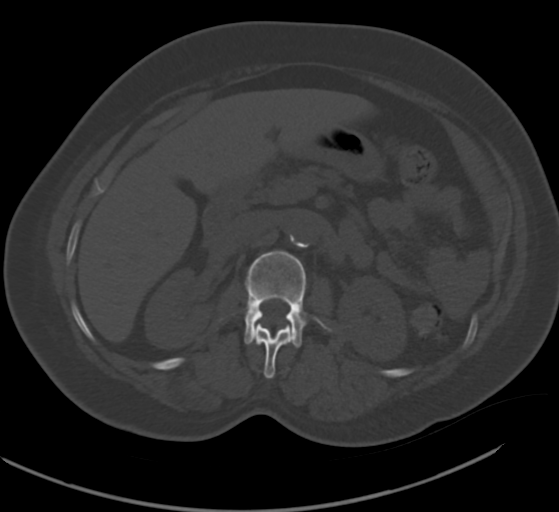
[im 68/88  soft-tissue]
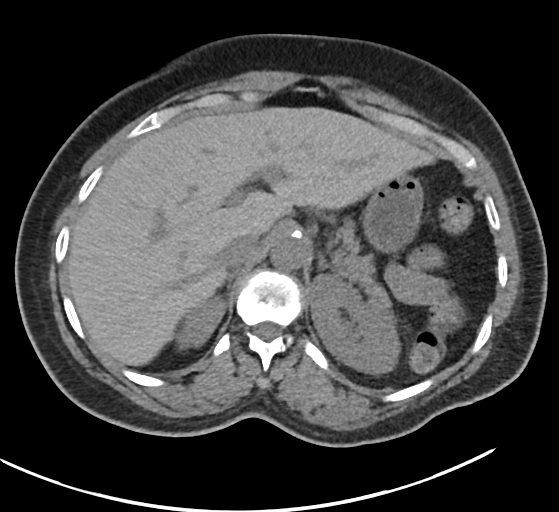
[im 76/88  soft-tissue]
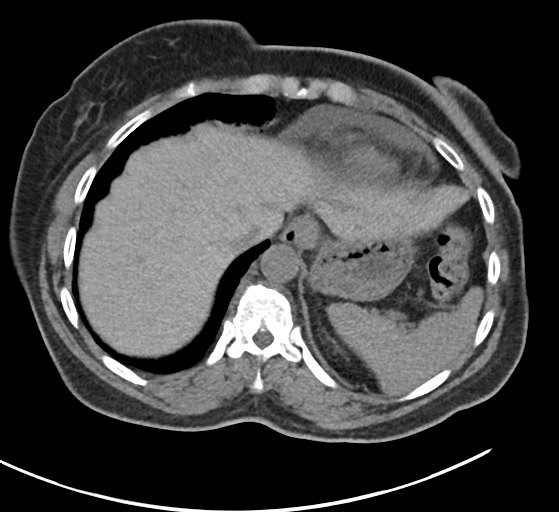
[im 84/88  soft-tissue]
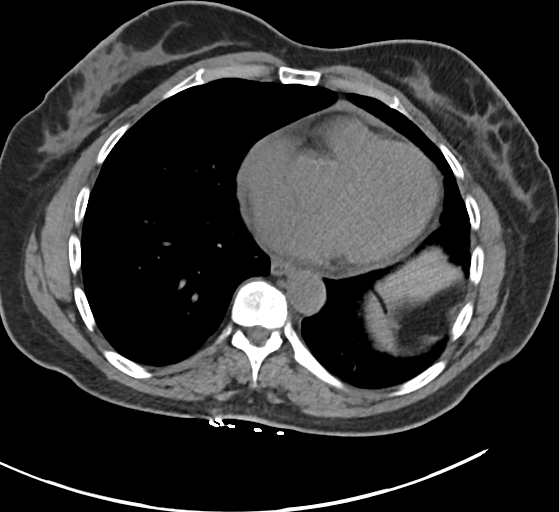

[Series 4: routine abdomen pelvis without 2.00 br40 s3 cor · coronal · non-contrast · 0.72mm/px · 3 of 171 slices shown]
[im 57/171  soft-tissue]
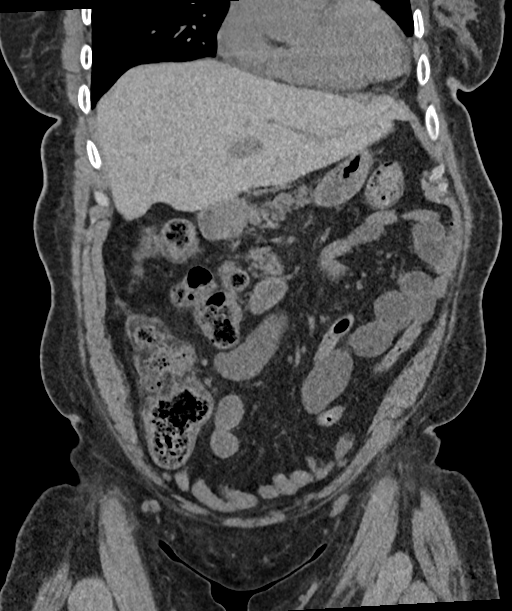
[im 76/171  soft-tissue]
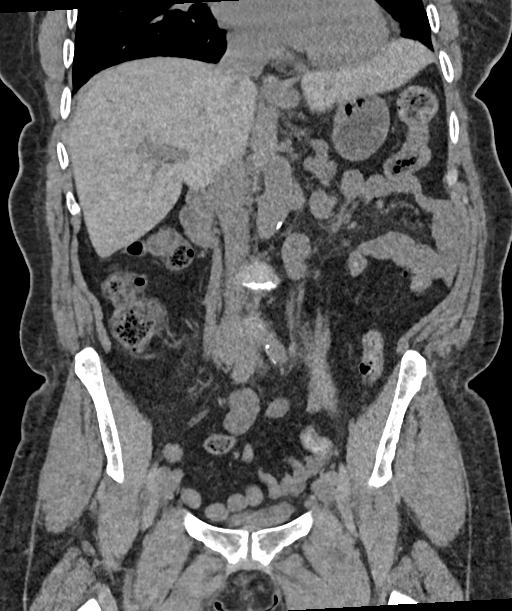
[im 95/171  soft-tissue]
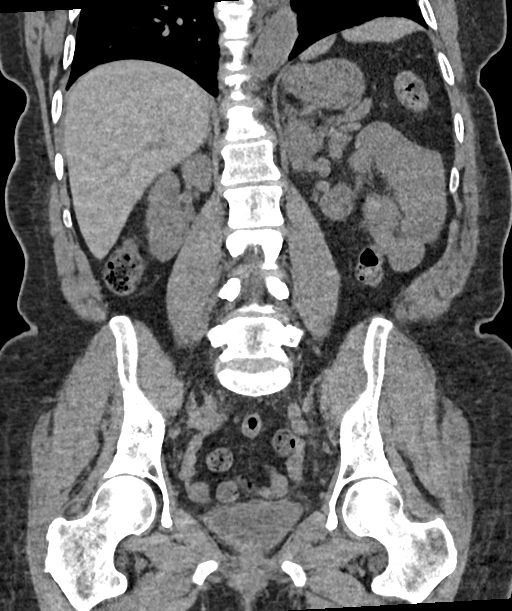

[15 of 46 positions shown; findings below may reference images not displayed]

FINDINGS: Lower chest: Linear scarring or atelectasis in the left lower lobe.
Descending thoracic aortic atherosclerosis. Small to moderate
primarily anterior pericardial effusion.

Hepatobiliary: Hyperdense precontrast appearance of the liver,
representative area 86 Hounsfield units, raising the possibility of
hemochromatosis. Cholecystectomy.

Pancreas: Unremarkable

Spleen: Unremarkable

Adrenals/Urinary Tract: Unremarkable

Stomach/Bowel: Fat deposition in the wall of the proximal colon,
typically this is incidental although there is a weak association
with inflammatory bowel disease. Normal appendix.

Vascular/Lymphatic: Atherosclerosis is present, including aortoiliac
atherosclerotic disease. No pathologic adenopathy. Subtle periaortic
retroperitoneal stranding at the bifurcation, significance
uncertain.

Reproductive: Unremarkable

Other: No supplemental non-categorized findings.

Musculoskeletal: Degenerative facet arthropathy at L5-S1. Mild lower
thoracic spondylosis. Small calcification adjacent to the tip of the
right L2 transverse process on image 31 of series 2 was not present
on 03/24/2004 but is probably chronic.
IMPRESSION: 1. Small to moderate primarily anterior pericardial effusion.
2. High density of the hepatic parenchyma raising the possibility of
hemochromatosis.
3. There is some subtle retroperitoneal stranding along the aortic
bifurcation, nonspecific, vasculitis not excluded, correlate with
serologic indicators of systemic inflammation.
4.  Aortic Atherosclerosis ([id number]).

## 2024-03-06 DIAGNOSIS — H16293 Other keratoconjunctivitis, bilateral: Secondary | ICD-10-CM | POA: Diagnosis not present

## 2024-03-08 ENCOUNTER — Ambulatory Visit (INDEPENDENT_AMBULATORY_CARE_PROVIDER_SITE_OTHER): Admitting: "Endocrinology

## 2024-03-08 ENCOUNTER — Encounter: Payer: Self-pay | Admitting: "Endocrinology

## 2024-03-08 VITALS — BP 124/80 | HR 56 | Ht 69.0 in | Wt 196.0 lb

## 2024-03-08 DIAGNOSIS — E039 Hypothyroidism, unspecified: Secondary | ICD-10-CM | POA: Diagnosis not present

## 2024-03-08 NOTE — Progress Notes (Signed)
 Outpatient Endocrinology Note [name], MD  03/08/24   Traci Brown 1956/07/06 [id number]  Referring Provider: Vernon Velna JONELLE, MD Primary Care Provider: Signa Dire, MD (Inactive) Subjective  No chief complaint on file.   Assessment & Plan  Diagnoses and all orders for this visit:  Hypothyroidism (acquired) -     TSH + free T4 -     TSH + free T4   Traci Brown is currently taking levothyroxine  50 mcg po every day. Patient was last biochemically hypothyroid.  Educated on thyroid  axis.  Recommend the following: Take levothyroxine  50 mcg every morning.  Advised to take levothyroxine  first thing in the morning on empty stomach and wait at least 30 minutes to 1 hour before eating or drinking anything or taking any other medications. Space out levothyroxine  by 4 hours from any acid reflux medication/fibrate/iron/calcium /multivitamin. Advised to take nutritional supplements in the evening. Repeat lab before next visit or sooner if symptoms of hyperthyroidism or hypothyroidism develop.  Notify us  immediately in case of significant weight gain or loss. Counseled on compliance and follow up needs.  I have reviewed current medications, nurse's notes, allergies, vital signs, past medical and surgical history, family medical history, and social history for this encounter. Counseled patient on symptoms, examination findings, lab findings, imaging results, treatment decisions and monitoring and prognosis. The patient understood the recommendations and agrees with the treatment plan. All questions regarding treatment plan were fully answered.   Return in about 3 months (around 06/08/2024) for visit + labs before next visit, labs today.   [name], MD  03/08/24   I have reviewed current medications, nurse's notes, allergies, vital signs, past medical and surgical history, family medical history, and social history for this encounter. Counseled patient  on symptoms, examination findings, lab findings, imaging results, treatment decisions and monitoring and prognosis. The patient understood the recommendations and agrees with the treatment plan. All questions regarding treatment plan were fully answered.   History of Present Illness Traci Brown is a 68 y.o. year old female who presents to our clinic with hypothyroidism diagnosed in 2024.    Symptoms suggestive of HYPOTHYROIDISM:  fatigue Yes, sometimes  weight gain No cold intolerance  Yes constipation  No  Symptoms suggestive of HYPERTHYROIDISM:  weight loss  No heat intolerance No hyperdefecation  No palpitations  No  Compressive symptoms:  dysphagia  No dysphonia  No positional dyspnea (especially with simultaneous arms elevation)  No  Smokes  No On biotin  No Personal history of head/neck surgery/irradiation  No   Physical Exam  BP 124/80   Pulse (!) 56   Ht 5' 9 (1.753 m)   Wt 196 lb (88.9 kg)   SpO2 96%   BMI 28.94 kg/m  Constitutional: well developed, well nourished Head: normocephalic, atraumatic, no exophthalmos Eyes: sclera anicteric, no redness Neck: no thyromegaly, no thyroid  tenderness; no nodules palpated Lungs: normal respiratory effort Neurology: alert and oriented, no fine hand tremor Skin: dry, no appreciable rashes Musculoskeletal: no appreciable defects Psychiatric: normal mood and affect  Allergies No Known Allergies  Current Medications Patient's Medications  New Prescriptions   No medications on file  Previous Medications   AMLODIPINE  (NORVASC ) 10 MG TABLET    Take 1 tablet (10 mg total) by mouth daily.   BEMPEDOIC ACID  (NEXLETOL ) 180 MG TABS    Take 1 tablet (180 mg total) by mouth daily.   CHLORTHALIDONE  (HYGROTON ) 25 MG TABLET    Take 1 tablet (25  mg total) by mouth daily.   DICLOFENAC  (VOLTAREN ) 75 MG EC TABLET    Take 1 tablet (75 mg total) by mouth 2 (two) times daily.   IRON POLYSACCHARIDES (NIFEREX) 150 MG CAPSULE     TAKE 1 CAPSULE BY MOUTH ONCE DAILY.   LEVOTHYROXINE  (SYNTHROID ) 50 MCG TABLET    Take 1 tablet (50 mcg total) by mouth in the morning on an empty stomach.   LOSARTAN  (COZAAR ) 25 MG TABLET    Take 1 tablet (25 mg total) by mouth daily.   METHOCARBAMOL  (ROBAXIN ) 500 MG TABLET    Take 1 tablet (500 mg total) by mouth 3 (three) times daily as needed for muscle spasms.   ROSUVASTATIN  (CRESTOR ) 20 MG TABLET    Take 1 tablet (20 mg total) by mouth daily.  Modified Medications   No medications on file  Discontinued Medications   No medications on file    Past Medical History Past Medical History:  Diagnosis Date   Abdominal pain, epigastric 06/18/2015   Anemia 05/02/2014   CKD (chronic kidney disease) stage 3, GFR 30-59 ml/min (HCC) 07/08/2012   Elevated LFTs    HLD (hyperlipidemia) 12/15/2011   Hypernatremia 07/22/2012   Hypertension 07/08/2012   Leukopenia 10/16/2014   Paroxysmal supraventricular tachycardia (HCC) 06/18/2015   Rhabdomyolysis 10/09/2014   Thoracic spine pain 03/01/2014   Vaginal atrophy     Past Surgical History Past Surgical History:  Procedure Laterality Date   BACK SURGERY     CESAREAN SECTION  2010   CHOLECYSTECTOMY     LACERATION REPAIR Right 2010   ON HANDS AND FACE   SVT ABLATION N/A 02/06/2020   Procedure: SVT ABLATION;  Surgeon: Waddell Danelle ORN, MD;  Location: [facility];  Service: Cardiovascular;  Laterality: N/A;   womb tied [place]in [place] exact procedure unknown)      Family History family history includes Hypertension in her mother; Other in her father and mother.  Social History Social History   Socioeconomic History   Marital status: Single    Spouse name: Not on file   Number of children: Not on file   Years of education: Not on file   Highest education level: Not on file  Occupational History   Occupation: Oncologist: [employer]  Tobacco Use   Smoking status: Never   Smokeless tobacco: Never  Substance and Sexual Activity    Alcohol use: No   Drug use: No   Sexual activity: Yes  Other Topics Concern   Not on file  Social History Narrative   Four children.  One lives I [place].  4 grands.     Social Drivers of Corporate investment banker Strain: High Risk (05/13/2022)   Received from [facility]   Overall Financial Resource Strain (CARDIA)    Difficulty of Paying Living Expenses: Hard  Food Insecurity: Medium Risk (05/17/2023)   Received from [facility]   Hunger Vital Sign    Within the past 12 months, you worried that your food would run out before you got money to buy more: Sometimes true    Within the past 12 months, the food you bought just didn't last and you didn't have money to get more. : Sometimes true  Transportation Needs: No Transportation Needs (05/17/2023)   Received from [facility]    In the past 12 months, has lack of reliable transportation kept you from medical appointments, meetings, work or from getting things needed  for daily living? : No  Physical Activity: Not on file  Stress: Not on file  Social Connections: Not on file  Intimate Partner Violence: Not on file    Laboratory Investigations Lab Results  Component Value Date   TSH 5.217 (H) 08/22/2019   TSH 5.680 (H) 06/18/2015     No results found for: TSI   No components found for: TRAB   Lab Results  Component Value Date   CHOL 422 (H) 06/18/2015   Lab Results  Component Value Date   HDL 32 (L) 06/18/2015   Lab Results  Component Value Date   LDLCALC Comment 06/18/2015   Lab Results  Component Value Date   TRIG 438 (H) 06/18/2015   Lab Results  Component Value Date   CHOLHDL 13.2 (H) 06/18/2015   Lab Results  Component Value Date   CREATININE 1.50 (H) 01/29/2020   No results found for: GFR    Component Value Date/Time   NA 140 01/29/2020 1452   K 3.9 01/29/2020 1452   CL 103 01/29/2020 1452   CO2 27 01/29/2020 1452   GLUCOSE 92 01/29/2020 1452   GLUCOSE 93 08/22/2019  1238   BUN 18 01/29/2020 1452   CREATININE 1.50 (H) 01/29/2020 1452   CREATININE 1.28 (H) 10/09/2014 1630   CALCIUM  9.7 01/29/2020 1452   PROT 6.9 10/09/2014 1630   ALBUMIN 3.8 10/09/2014 1630   AST 50 (H) 10/09/2014 1630   ALT 25 10/09/2014 1630   ALKPHOS 50 10/09/2014 1630   BILITOT 0.4 10/09/2014 1630   GFRNONAA 37 (L) 01/29/2020 1452   GFRNONAA 47 (L) 10/09/2014 1630   GFRAA 43 (L) 01/29/2020 1452   GFRAA 54 (L) 10/09/2014 1630      Latest Ref Rng & Units 01/29/2020    2:52 PM 08/22/2019   12:38 PM 07/23/2017   11:24 AM  BMP  Glucose 65 - 99 mg/dL 92  93  99   BUN 8 - 27 mg/dL 18  14  17    Creatinine 0.57 - 1.00 mg/dL 8.49  8.71  8.72   BUN/Creat Ratio 12 - 28 12     Sodium 134 - 144 mmol/L 140  139  136   Potassium 3.5 - 5.2 mmol/L 3.9  5.1  3.2   Chloride 96 - 106 mmol/L 103  103  103   CO2 20 - 29 mmol/L 27  25  26    Calcium  8.7 - 10.3 mg/dL 9.7  9.3  9.3        Component Value Date/Time   WBC 4.0 01/29/2020 1452   WBC 2.8 (L) 08/22/2019 1238   RBC 3.55 (L) 01/29/2020 1452   RBC 3.77 (L) 08/22/2019 1238   HGB 10.7 (L) 11/24/2022 0925   HGB 9.4 (L) 01/29/2020 1452   HCT 29.0 (L) 01/29/2020 1452   PLT 227 01/29/2020 1452   MCV 82 01/29/2020 1452   MCH 26.5 (L) 01/29/2020 1452   MCH 27.1 08/22/2019 1238   MCHC 32.4 01/29/2020 1452   MCHC 31.1 08/22/2019 1238   RDW 16.9 (H) 01/29/2020 1452   LYMPHSABS 2.2 01/29/2020 1452   MONOABS 0.2 08/22/2019 1238   EOSABS 0.2 01/29/2020 1452   BASOSABS 0.0 01/29/2020 1452      Parts of this note may have been dictated using voice recognition software. There may be variances in spelling and vocabulary which are unintentional. Not all errors are proofread. Please notify the dino if any discrepancies are noted or if the meaning of any statement  is not clear.

## 2024-03-09 ENCOUNTER — Ambulatory Visit: Payer: Self-pay | Admitting: "Endocrinology

## 2024-03-09 LAB — T4, FREE: Free T4: 0.3 ng/dL — ABNORMAL LOW (ref 0.8–1.8)

## 2024-03-09 LAB — TSH+FREE T4: TSH W/REFLEX TO FT4: 6.02 m[IU]/L — ABNORMAL HIGH (ref 0.40–4.50)

## 2024-03-20 ENCOUNTER — Encounter (HOSPITAL_BASED_OUTPATIENT_CLINIC_OR_DEPARTMENT_OTHER): Admitting: Nurse Practitioner

## 2024-03-29 NOTE — Progress Notes (Deleted)
 Cardiology Office Note:  .   Date:  03/29/2024  ID:  Traci Brown, DOB 1955-12-01, MRN [id number] PCP: Signa Dire, MD (Inactive)  [facility] Providers Cardiologist:  Lynwood Schilling, MD    Patient Profile: .      PMH SVT S/p ablation June 2021 Hypertension Familial hyperlipidemia CKD stage III Aortic atherosclerosis  Referred to advanced lipid disorder clinic and seen by Dr. Mona 04/02/2021.  She has a history of very high cholesterol and is followed by Dr. Schilling for general cardiology and Dr. Waddell for SVT status post ablation June 2021.  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  She fled [place] during conflict at which time unfortunately her parents and her husband were killed.  She has 4 grown daughters.  February 2022 total cholesterol was 431, HDL 40, triglycerides 354, and LDL 306.  Findings suggestive of familial combined hyperlipidemia or possibly a combination of FH/dysbetalipoproteinemia.  At the time of referral she was not taking any lipid-lowering medications.  She had reflux symptoms on atorvastatin .  She had negative stress test and CTA of chest in 2016 not remarkable for any coronary calcium .  She was advised to start rosuvastatin  20 mg daily.  LP(a) was negative.  Genetic testing revealed no pathologic variant, multiple variants of unknown significance including mutations in APO E (E2/A3), LDL receptor, LPL CETP, L IPG, 2 mutations in the LPA gene and 2 mutations affecting obesity and 1 affecting endothelial dysfunction.  She qualified as a compound heterozygote and at significantly increased risk of heart disease.  NMR 08/21/2021 revealed LDL particle number 1817, LDL-C 135, HDL-C 42, triglycerides 168, total cholesterol 207, and small LDL-P 1384. Was advised to add Repatha  in addition to rosuvastatin  20 mg daily and return in 3-4 months for follow-up testing.   Seen in follow-up by me on 12/08/23 at which time she was feeling well, continuing to  work as a transporter at [employer]. There is some confusion about Repatha  and she did not start the medication. She is concerned about recent lipid panel that revealed total cholesterol 415, HDL 42, LDL 331, and triglycerides 192. Reported eating a healthier diet of traditional African food which is low in calories and fat. Active at work as well as walking in her yard.  Occasional chest soreness that improves when she rubs it.  This is most noticeable when moving heavy patients at work.  No shortness of breath, orthopnea, PND, palpitations, presyncope, or syncope. Through further discussion, she did not want to start injectable lipid lowering agent. She was started on Nexletol  180 mg daily and advised to return for fasting labs in 2-3 months.        History of Present Illness: .   Traci Brown is a very pleasant 68 y.o. female who is here today for follow-up of dyslipidemia.     Discussed the use of AI scribe software for clinical note transcription with the patient, who gave verbal consent to proceed.   ROS: See HPI       Studies Reviewed: SABRA         Lipoprotein (a)  Date/Time Value Ref Range Status  08/21/2021 04:26 PM 66.0 <75.0 nmol/L Final    Comment:    Note:  Values greater than or equal to 75.0 nmol/L may        indicate an independent risk factor for CHD,        but must be evaluated with caution when applied  to non-Caucasian populations due to the        influence of genetic factors on Lp(a) across        ethnicities.      Risk Assessment/Calculations:     No BP recorded.  {Refresh Note OR Click here to enter BP  :1}***       Physical Exam:   VS:  There were no vitals taken for this visit.   Wt Readings from Last 3 Encounters:  03/08/24 196 lb (88.9 kg)  12/08/23 194 lb (88 kg)  11/05/21 195 lb (88.5 kg)    GEN: Well nourished, well developed in no acute distress NECK: No JVD; No carotid bruits CARDIAC: RRR, no murmurs, rubs, gallops RESPIRATORY:  Clear  to auscultation without rales, wheezing or rhonchi  ABDOMEN: Soft, non-tender, non-distended EXTREMITIES:  No edema; No deformity     ASSESSMENT AND PLAN: .    Familial hyperlipidemia LDL goal < 70: Lipid panel completed 11/15/2023 reveals total cholesterol 415, HDL 42, LDL 331, and triglycerides 192.  Was previously prescribed Repatha  but does not think she ever started it. Reports compliance with rosuvastatin  20 mg daily.  Lengthy discussion about adding bempedoic acid  or PCSK9 inhibitor.  She would prefer not giving herself an injection and would like to start with Nexletol  if approved by insurance. Continue rosuvastatin . We will see her back in 3 months with repeat lipid testing prior.   Aortic atherosclerosis/cardiac risk assessment: Negative stress test in 2016. EKG today reveals normal sinus rhythm.  She denies chest pain, dyspnea, or other symptoms concerning for angina.  No indication for further ischemic evaluation at this time.  Will add Nexletol  180 mg daily in addition to rosuvastatin  20 mg daily for LDL goal 70 or lower.  Hypertension: BP is well-controlled.  Management per PCP.       Disposition: ***  Signed, Rosaline Bane, NP-C

## 2024-04-03 ENCOUNTER — Encounter (HOSPITAL_BASED_OUTPATIENT_CLINIC_OR_DEPARTMENT_OTHER): Admitting: Nurse Practitioner

## 2024-05-16 ENCOUNTER — Other Ambulatory Visit: Payer: Self-pay | Admitting: Nurse Practitioner

## 2024-05-16 DIAGNOSIS — R7401 Elevation of levels of liver transaminase levels: Secondary | ICD-10-CM | POA: Diagnosis not present

## 2024-05-16 DIAGNOSIS — K760 Fatty (change of) liver, not elsewhere classified: Secondary | ICD-10-CM | POA: Diagnosis not present

## 2024-05-22 ENCOUNTER — Encounter: Payer: Self-pay | Admitting: Nurse Practitioner

## 2024-05-22 ENCOUNTER — Encounter: Payer: Self-pay | Admitting: Internal Medicine

## 2024-05-22 DIAGNOSIS — Z1231 Encounter for screening mammogram for malignant neoplasm of breast: Secondary | ICD-10-CM

## 2024-05-25 ENCOUNTER — Telehealth: Payer: Self-pay

## 2024-05-25 NOTE — Telephone Encounter (Signed)
 Pt lvm on co works vm. Called pt back, pt called regarding upcoming appt. I informed time and date of appt.

## 2024-05-29 ENCOUNTER — Ambulatory Visit
Admission: RE | Admit: 2024-05-29 | Discharge: 2024-05-29 | Disposition: A | Discharge status: Home / Self Care | Source: Ambulatory Visit | Attending: Nurse Practitioner | Admitting: Nurse Practitioner

## 2024-05-29 DIAGNOSIS — K7689 Other specified diseases of liver: Secondary | ICD-10-CM | POA: Diagnosis not present

## 2024-05-29 DIAGNOSIS — K760 Fatty (change of) liver, not elsewhere classified: Secondary | ICD-10-CM

## 2024-05-29 DIAGNOSIS — R7401 Elevation of levels of liver transaminase levels: Secondary | ICD-10-CM

## 2024-05-30 ENCOUNTER — Telehealth: Payer: Self-pay

## 2024-05-30 NOTE — Telephone Encounter (Signed)
 Pt lvm wanting to know what doctor she what seeing on 06/05/24. Called pt back and informed pt that she would be seeing Dr Dartha.

## 2024-05-31 ENCOUNTER — Other Ambulatory Visit (HOSPITAL_COMMUNITY): Payer: Self-pay

## 2024-05-31 ENCOUNTER — Other Ambulatory Visit

## 2024-05-31 LAB — TSH+FREE T4: TSH W/REFLEX TO FT4: 3.11 m[IU]/L (ref 0.40–4.50)

## 2024-05-31 MED ORDER — LEVOTHYROXINE SODIUM 50 MCG PO TABS
50.0000 ug | ORAL_TABLET | Freq: Every morning | ORAL | 1 refills | Status: AC
  Filled 2024-05-31: qty 90, 90d supply, fill #0

## 2024-06-05 ENCOUNTER — Ambulatory Visit (INDEPENDENT_AMBULATORY_CARE_PROVIDER_SITE_OTHER): Admitting: "Endocrinology

## 2024-06-05 ENCOUNTER — Encounter: Payer: Self-pay | Admitting: "Endocrinology

## 2024-06-05 VITALS — BP 110/80 | HR 67 | Ht 69.0 in | Wt 193.0 lb

## 2024-06-05 DIAGNOSIS — E039 Hypothyroidism, unspecified: Secondary | ICD-10-CM

## 2024-06-05 NOTE — Progress Notes (Signed)
 Outpatient Endocrinology Note Traci Birmingham, MD  06/05/24   Traci Brown [birth year] [id number]  Referring Provider: No ref. provider found Primary Care Provider: Signa Dire, MD (Inactive) Subjective  No chief complaint on file.   Assessment & Plan  Diagnoses and all orders for this visit:  Hypothyroidism (acquired) -     TSH + free T4    Traci Brown is currently taking levothyroxine  50 mcg po every day. Patient was last biochemically hypothyroid.  Educated on thyroid  axis.  Recommend the following: Take levothyroxine  50 mcg every morning.  Advised to take levothyroxine  first thing in the morning on empty stomach and wait at least 30 minutes to 1 hour before eating or drinking anything or taking any other medications. Space out levothyroxine  by 4 hours from any acid reflux medication/fibrate/iron/calcium /multivitamin. Advised to take nutritional supplements in the evening. Repeat lab before next visit or sooner if symptoms of hyperthyroidism or hypothyroidism develop.  Notify us  immediately in case of significant weight gain or loss. Counseled on compliance and follow up needs.  I have reviewed current medications, nurse's notes, allergies, vital signs, past medical and surgical history, family medical history, and social history for this encounter. Counseled patient on symptoms, examination findings, lab findings, imaging results, treatment decisions and monitoring and prognosis. The patient understood the recommendations and agrees with the treatment plan. All questions regarding treatment plan were fully answered.   Return in about 6 months (around 12/03/2024) for visit + labs before next visit.   Traci Birmingham, MD  06/05/24   I have reviewed current medications, nurse's notes, allergies, vital signs, past medical and surgical history, family medical history, and social history for this encounter. Counseled patient on symptoms, examination  findings, lab findings, imaging results, treatment decisions and monitoring and prognosis. The patient understood the recommendations and agrees with the treatment plan. All questions regarding treatment plan were fully answered.   History of Present Illness Traci Brown is a 68 y.o. year old female who presents to our clinic with hypothyroidism diagnosed in 2024.    Symptoms suggestive of HYPOTHYROIDISM:  fatigue Yes, sometimes  weight gain No cold intolerance  Yes constipation  Yes  Symptoms suggestive of HYPERTHYROIDISM:  weight loss  No heat intolerance Yes hyperdefecation  No palpitations  No  Compressive symptoms:  dysphagia  No dysphonia  No positional dyspnea (especially with simultaneous arms elevation)  No  Smokes  No On biotin  No Personal history of head/neck surgery/irradiation  No   Physical Exam  BP 110/80   Pulse 67   Ht 5' 9 (1.753 m)   Wt 193 lb (87.5 kg)   SpO2 96%   BMI 28.50 kg/m  Constitutional: well developed, well nourished Head: normocephalic, atraumatic, no exophthalmos Eyes: sclera anicteric, no redness Neck: no thyromegaly, no thyroid  tenderness; no nodules palpated Lungs: normal respiratory effort Neurology: alert and oriented, no fine hand tremor Skin: dry, no appreciable rashes Musculoskeletal: no appreciable defects Psychiatric: normal mood and affect  Allergies No Known Allergies  Current Medications Patient's Medications  New Prescriptions   No medications on file  Previous Medications   AMLODIPINE  (NORVASC ) 10 MG TABLET    Take 1 tablet (10 mg total) by mouth daily.   BEMPEDOIC ACID  (NEXLETOL ) 180 MG TABS    Take 1 tablet (180 mg total) by mouth daily.   CHLORTHALIDONE  (HYGROTON ) 25 MG TABLET    Take 1 tablet (25 mg total) by mouth daily.   DICLOFENAC  (VOLTAREN ) 75 MG  EC TABLET    Take 1 tablet (75 mg total) by mouth 2 (two) times daily.   IRON POLYSACCHARIDES (NIFEREX) 150 MG CAPSULE    TAKE 1 CAPSULE BY MOUTH  ONCE DAILY.   LEVOTHYROXINE  (SYNTHROID ) 50 MCG TABLET    Take 1 tablet (50 mcg total) by mouth in the morning on an empty stomach.   LEVOTHYROXINE  (SYNTHROID ) 50 MCG TABLET    Take 1 tablet (50 mcg total) by mouth every morning on an empty stomach.   LOSARTAN  (COZAAR ) 25 MG TABLET    Take 1 tablet (25 mg total) by mouth daily.   METHOCARBAMOL  (ROBAXIN ) 500 MG TABLET    Take 1 tablet (500 mg total) by mouth 3 (three) times daily as needed for muscle spasms.   ROSUVASTATIN  (CRESTOR ) 20 MG TABLET    Take 1 tablet (20 mg total) by mouth daily.  Modified Medications   No medications on file  Discontinued Medications   No medications on file    Past Medical History Past Medical History:  Diagnosis Date   Abdominal pain, epigastric 06/18/2015   Anemia 05/02/2014   CKD (chronic kidney disease) stage 3, GFR 30-59 ml/min (HCC) 07/08/2012   Elevated LFTs    HLD (hyperlipidemia) 12/15/2011   Hypernatremia 07/22/2012   Hypertension 07/08/2012   Leukopenia 10/16/2014   Paroxysmal supraventricular tachycardia 06/18/2015   Rhabdomyolysis 10/09/2014   Thoracic spine pain 03/01/2014   Vaginal atrophy     Past Surgical History Past Surgical History:  Procedure Laterality Date   BACK SURGERY     CESAREAN SECTION  2010   CHOLECYSTECTOMY     LACERATION REPAIR Right 2010   ON HANDS AND FACE   SVT ABLATION N/A 02/06/2020   Procedure: SVT ABLATION;  Surgeon: Waddell Danelle ORN, MD;  Location: [facility];  Service: Cardiovascular;  Laterality: N/A;   womb tied (in [place] exact procedure unknown)      Family History family history includes Hypertension in her mother; Other in her father and mother.  Social History Social History   Socioeconomic History   Marital status: Single    Spouse name: Not on file   Number of children: Not on file   Years of education: Not on file   Highest education level: Not on file  Occupational History   Occupation: Oncologist: [employer]  Tobacco  Use   Smoking status: Never   Smokeless tobacco: Never  Substance and Sexual Activity   Alcohol use: No   Drug use: No   Sexual activity: Yes  Other Topics Concern   Not on file  Social History Narrative   Four children.  One lives I [place].  4 grands.     Social Drivers of Corporate investment banker Strain: High Risk (05/13/2022)   Received from [facility]   Overall Financial Resource Strain (CARDIA)    Difficulty of Paying Living Expenses: Hard  Food Insecurity: Medium Risk (05/17/2023)   Received from [facility]   Hunger Vital Sign    Within the past 12 months, you worried that your food would run out before you got money to buy more: Sometimes true    Within the past 12 months, the food you bought just didn't last and you didn't have money to get more. : Sometimes true  Transportation Needs: No Transportation Needs (05/17/2023)   Received from [facility]    In the past 12 months, has lack of reliable transportation  kept you from medical appointments, meetings, work or from getting things needed for daily living? : No  Physical Activity: Not on file  Stress: Not on file  Social Connections: Not on file  Intimate Partner Violence: Not on file    Laboratory Investigations Lab Results  Component Value Date   TSH 5.217 (H) 08/22/2019   TSH 5.680 (H) 06/18/2015   FREET4 0.3 (L) 03/08/2024     No results found for: TSI   No components found for: TRAB   Lab Results  Component Value Date   CHOL 422 (H) 06/18/2015   Lab Results  Component Value Date   HDL 32 (L) 06/18/2015   Lab Results  Component Value Date   LDLCALC Comment 06/18/2015   Lab Results  Component Value Date   TRIG 438 (H) 06/18/2015   Lab Results  Component Value Date   CHOLHDL 13.2 (H) 06/18/2015   Lab Results  Component Value Date   CREATININE 1.50 (H) 01/29/2020   No results found for: GFR    Component Value Date/Time   NA 140 01/29/2020 1452   K 3.9  01/29/2020 1452   CL 103 01/29/2020 1452   CO2 27 01/29/2020 1452   GLUCOSE 92 01/29/2020 1452   GLUCOSE 93 08/22/2019 1238   BUN 18 01/29/2020 1452   CREATININE 1.50 (H) 01/29/2020 1452   CREATININE 1.28 (H) 10/09/2014 1630   CALCIUM  9.7 01/29/2020 1452   PROT 6.9 10/09/2014 1630   ALBUMIN 3.8 10/09/2014 1630   AST 50 (H) 10/09/2014 1630   ALT 25 10/09/2014 1630   ALKPHOS 50 10/09/2014 1630   BILITOT 0.4 10/09/2014 1630   GFRNONAA 37 (L) 01/29/2020 1452   GFRNONAA 47 (L) 10/09/2014 1630   GFRAA 43 (L) 01/29/2020 1452   GFRAA 54 (L) 10/09/2014 1630      Latest Ref Rng & Units 01/29/2020    2:52 PM 08/22/2019   12:38 PM 07/23/2017   11:24 AM  BMP  Glucose 65 - 99 mg/dL 92  93  99   BUN 8 - 27 mg/dL 18  14  17    Creatinine 0.57 - 1.00 mg/dL 8.49  8.71  8.72   BUN/Creat Ratio 12 - 28 12     Sodium 134 - 144 mmol/L 140  139  136   Potassium 3.5 - 5.2 mmol/L 3.9  5.1  3.2   Chloride 96 - 106 mmol/L 103  103  103   CO2 20 - 29 mmol/L 27  25  26    Calcium  8.7 - 10.3 mg/dL 9.7  9.3  9.3        Component Value Date/Time   WBC 4.0 01/29/2020 1452   WBC 2.8 (L) 08/22/2019 1238   RBC 3.55 (L) 01/29/2020 1452   RBC 3.77 (L) 08/22/2019 1238   HGB 10.7 (L) 11/24/2022 0925   HGB 9.4 (L) 01/29/2020 1452   HCT 29.0 (L) 01/29/2020 1452   PLT 227 01/29/2020 1452   MCV 82 01/29/2020 1452   MCH 26.5 (L) 01/29/2020 1452   MCH 27.1 08/22/2019 1238   MCHC 32.4 01/29/2020 1452   MCHC 31.1 08/22/2019 1238   RDW 16.9 (H) 01/29/2020 1452   LYMPHSABS 2.2 01/29/2020 1452   MONOABS 0.2 08/22/2019 1238   EOSABS 0.2 01/29/2020 1452   BASOSABS 0.0 01/29/2020 1452      Parts of this note may have been dictated using voice recognition software. There may be variances in spelling and vocabulary which are unintentional. Not all errors  are proofread. Please notify the dino if any discrepancies are noted or if the meaning of any statement is not clear.

## 2024-06-14 ENCOUNTER — Other Ambulatory Visit: Payer: Self-pay | Admitting: Internal Medicine

## 2024-06-14 DIAGNOSIS — Z1231 Encounter for screening mammogram for malignant neoplasm of breast: Secondary | ICD-10-CM

## 2024-07-19 ENCOUNTER — Ambulatory Visit
Admission: RE | Admit: 2024-07-19 | Discharge: 2024-07-19 | Disposition: A | Discharge status: Home / Self Care | Source: Ambulatory Visit | Attending: Internal Medicine | Admitting: Internal Medicine

## 2024-07-19 DIAGNOSIS — Z1231 Encounter for screening mammogram for malignant neoplasm of breast: Secondary | ICD-10-CM

## 2024-07-24 ENCOUNTER — Other Ambulatory Visit (HOSPITAL_COMMUNITY): Payer: Self-pay

## 2024-07-25 ENCOUNTER — Other Ambulatory Visit (HOSPITAL_COMMUNITY): Payer: Self-pay

## 2024-07-25 MED ORDER — AMLODIPINE BESYLATE 10 MG PO TABS
10.0000 mg | ORAL_TABLET | Freq: Every day | ORAL | 2 refills | Status: AC
  Filled 2024-07-25: qty 90, 90d supply, fill #0

## 2024-07-28 ENCOUNTER — Other Ambulatory Visit (HOSPITAL_COMMUNITY): Payer: Self-pay

## 2024-11-29 ENCOUNTER — Other Ambulatory Visit

## 2024-12-04 ENCOUNTER — Ambulatory Visit: Admitting: "Endocrinology
